# Patient Record
Sex: Male | Born: 1980 | Race: Black or African American | Hispanic: No | Marital: Single | Smoking: Former smoker
Health system: Southern US, Community
[De-identification: ages and names within clinical notes are randomized; demographics above are authoritative.]

## PROBLEM LIST (undated history)

## (undated) HISTORY — PX: SKIN GRAFT: SHX250

---
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
## 2004-10-13 ENCOUNTER — Emergency Department (HOSPITAL_COMMUNITY): Admission: EM | Admit: 2004-10-13 | Discharge: 2004-10-13 | Payer: Self-pay | Admitting: Family Medicine

## 2010-11-28 ENCOUNTER — Emergency Department (HOSPITAL_COMMUNITY)
Admission: EM | Admit: 2010-11-28 | Discharge: 2010-11-28 | Disposition: A | Discharge status: Home / Self Care | Payer: No Typology Code available for payment source | Attending: Emergency Medicine | Admitting: Emergency Medicine

## 2010-11-28 DIAGNOSIS — M545 Low back pain, unspecified: Secondary | ICD-10-CM | POA: Insufficient documentation

## 2010-11-28 DIAGNOSIS — S139XXA Sprain of joints and ligaments of unspecified parts of neck, initial encounter: Secondary | ICD-10-CM | POA: Insufficient documentation

## 2010-11-28 DIAGNOSIS — M542 Cervicalgia: Secondary | ICD-10-CM | POA: Insufficient documentation

## 2010-11-28 DIAGNOSIS — S335XXA Sprain of ligaments of lumbar spine, initial encounter: Secondary | ICD-10-CM | POA: Insufficient documentation

## 2011-07-26 ENCOUNTER — Emergency Department (HOSPITAL_COMMUNITY)
Admission: EM | Admit: 2011-07-26 | Discharge: 2011-07-26 | Disposition: A | Discharge status: Home / Self Care | Payer: Self-pay | Attending: Emergency Medicine | Admitting: Emergency Medicine

## 2011-07-26 ENCOUNTER — Encounter (HOSPITAL_COMMUNITY): Payer: Self-pay | Admitting: Emergency Medicine

## 2011-07-26 DIAGNOSIS — R070 Pain in throat: Secondary | ICD-10-CM | POA: Insufficient documentation

## 2011-07-26 DIAGNOSIS — F17200 Nicotine dependence, unspecified, uncomplicated: Secondary | ICD-10-CM | POA: Insufficient documentation

## 2011-07-26 DIAGNOSIS — R109 Unspecified abdominal pain: Secondary | ICD-10-CM | POA: Insufficient documentation

## 2011-07-26 DIAGNOSIS — IMO0001 Reserved for inherently not codable concepts without codable children: Secondary | ICD-10-CM | POA: Insufficient documentation

## 2011-07-26 DIAGNOSIS — R197 Diarrhea, unspecified: Secondary | ICD-10-CM | POA: Insufficient documentation

## 2011-07-26 DIAGNOSIS — R51 Headache: Secondary | ICD-10-CM | POA: Insufficient documentation

## 2011-07-26 DIAGNOSIS — R6889 Other general symptoms and signs: Secondary | ICD-10-CM | POA: Insufficient documentation

## 2011-07-26 DIAGNOSIS — R1110 Vomiting, unspecified: Secondary | ICD-10-CM | POA: Insufficient documentation

## 2011-07-26 DIAGNOSIS — R509 Fever, unspecified: Secondary | ICD-10-CM | POA: Insufficient documentation

## 2011-07-26 DIAGNOSIS — Z79899 Other long term (current) drug therapy: Secondary | ICD-10-CM | POA: Insufficient documentation

## 2011-07-26 DIAGNOSIS — J3489 Other specified disorders of nose and nasal sinuses: Secondary | ICD-10-CM | POA: Insufficient documentation

## 2011-07-26 DIAGNOSIS — H9209 Otalgia, unspecified ear: Secondary | ICD-10-CM | POA: Insufficient documentation

## 2011-07-26 DIAGNOSIS — R05 Cough: Secondary | ICD-10-CM | POA: Insufficient documentation

## 2011-07-26 DIAGNOSIS — R059 Cough, unspecified: Secondary | ICD-10-CM | POA: Insufficient documentation

## 2011-07-26 MED ORDER — GUAIFENESIN-CODEINE 100-6.3 MG/5ML PO SOLN: 5.0000 mL | Freq: Three times a day (TID) | ORAL | Status: DC

## 2011-07-26 NOTE — ED Notes (Signed)
Per Pt: cough for one week,  general malaise, some dizziness, poor appetite, and sore throat that started 2 nights ago. Hard time sleeping with cough. Headache also. Pt denies OTC medications for symptoms.

## 2011-07-26 NOTE — ED Provider Notes (Signed)
History     CSN: [id number]  Arrival date & time 07/26/11  1725   First MD Initiated Contact with Patient 07/26/11 2001      Chief Complaint  Patient presents with  . URI    (Consider location/radiation/quality/duration/timing/severity/associated sxs/prior treatment) HPI  Pt presents to the ED with complaints of flu-like symptoms of cough, congestion, sore throat, muscle aches, chills, fevers, ear pain, headaches, abdominal pain, vomiting, diarrhea. The patient states that the symptoms started 1 week ago.  Pt has been around other sick contacts and did not get the flu shot this year. The patient denies headaches, neck pain, weakness, vision changes, severe abdominal pain, inability to eat or drink, difficulty breathing, SOB, wheezing, chest pain. The patient has tried cough medicine, NSAIDS, and rest but has only felt mild relief.    History reviewed. No pertinent past medical history.  History reviewed. No pertinent past surgical history.  No family history on file.  History  Substance Use Topics  . Smoking status: Current Some Day Smoker  . Smokeless tobacco: Not on file  . Alcohol Use: No      Review of Systems  All other systems reviewed and are negative.    Allergies  Review of patient's allergies indicates no known allergies.  Home Medications   Current Outpatient Rx  Name Route Sig Dispense Refill  . GUAIFENESIN-CODEINE 100-6.3 MG/5ML PO SOLN Oral Take 5 mLs by mouth 3 (three) times daily. 150 mL 0    BP 112/63  Pulse 73  Temp(Src) 98.6 F (37 C) (Oral)  Resp 18  Ht 6\' 2"  (1.88 m)  Wt 185 lb (83.915 kg)  BMI 23.75 kg/m2  SpO2 99%  Physical Exam  Nursing note and vitals reviewed. Constitutional: He is oriented to person, place, and time. He appears well-developed and well-nourished.  HENT:  Head: Normocephalic and atraumatic.  Eyes: EOM are normal. Pupils are equal, round, and reactive to light.  Neck: Normal range of motion.  Cardiovascular:  Normal rate and regular rhythm.   Pulmonary/Chest: Effort normal and breath sounds normal.  Abdominal: Soft.  Musculoskeletal: Normal range of motion.  Neurological: He is alert and oriented to person, place, and time.  Skin: Skin is warm and dry.    ED Course  Procedures (including critical care time)  Labs Reviewed - No data to display No results found.   1. Flu-like symptoms       MDM  Pts flu-like symptoms. Treating symptomatically with Cough and pain medication.        Dorthula Matas, PA 07/26/11 2201

## 2011-07-27 NOTE — ED Provider Notes (Signed)
Medical screening examination/treatment/procedure(s) were performed by non-physician practitioner and as supervising physician I was immediately available for consultation/collaboration. Devoria Albe, MD, Armando Gang   Ward Givens, MD 07/27/11 1130

## 2012-06-08 ENCOUNTER — Emergency Department (HOSPITAL_COMMUNITY)
Admission: EM | Admit: 2012-06-08 | Discharge: 2012-06-08 | Disposition: A | Discharge status: Home / Self Care | Payer: Self-pay | Attending: Emergency Medicine | Admitting: Emergency Medicine

## 2012-06-08 ENCOUNTER — Encounter (HOSPITAL_COMMUNITY): Payer: Self-pay | Admitting: Emergency Medicine

## 2012-06-08 DIAGNOSIS — K921 Melena: Secondary | ICD-10-CM | POA: Insufficient documentation

## 2012-06-08 DIAGNOSIS — K6289 Other specified diseases of anus and rectum: Secondary | ICD-10-CM | POA: Insufficient documentation

## 2012-06-08 DIAGNOSIS — F17200 Nicotine dependence, unspecified, uncomplicated: Secondary | ICD-10-CM | POA: Insufficient documentation

## 2012-06-08 DIAGNOSIS — B379 Candidiasis, unspecified: Secondary | ICD-10-CM | POA: Insufficient documentation

## 2012-06-08 LAB — WET PREP, GENITAL
Clue Cells Wet Prep HPF POC: NONE SEEN
Trich, Wet Prep: NONE SEEN
WBC, Wet Prep HPF POC: NONE SEEN
Yeast Wet Prep HPF POC: NONE SEEN

## 2012-06-08 LAB — CBC
HCT: 42.7 % (ref 39.0–52.0)
Hemoglobin: 14.6 g/dL (ref 13.0–17.0)
MCH: 31.8 pg (ref 26.0–34.0)
MCHC: 34.2 g/dL (ref 30.0–36.0)
MCV: 93 fL (ref 78.0–100.0)
Platelets: 233 10*3/uL (ref 150–400)
RBC: 4.59 MIL/uL (ref 4.22–5.81)
RDW: 11.7 % (ref 11.5–15.5)
WBC: 7.3 10*3/uL (ref 4.0–10.5)

## 2012-06-08 LAB — BASIC METABOLIC PANEL
BUN: 11 mg/dL (ref 6–23)
CO2: 28 mEq/L (ref 19–32)
Calcium: 9.3 mg/dL (ref 8.4–10.5)
Chloride: 102 mEq/L (ref 96–112)
Creatinine, Ser: 1.1 mg/dL (ref 0.50–1.35)
GFR calc Af Amer: >90 mL/min (ref >90)
GFR calc non Af Amer: 89 mL/min — ABNORMAL LOW (ref >90)
Glucose, Bld: 96 mg/dL (ref 70–99)
Potassium: 3.5 mEq/L (ref 3.5–5.1)
Sodium: 141 mEq/L (ref 135–145)

## 2012-06-08 LAB — GLUCOSE, CAPILLARY: Glucose-Capillary: 89 mg/dL (ref 70–99)

## 2012-06-08 LAB — GRAM STAIN

## 2012-06-08 MED ORDER — MUPIROCIN CALCIUM 2 % EX CREA: TOPICAL_CREAM | Freq: Three times a day (TID) | CUTANEOUS | Status: DC

## 2012-06-08 MED ORDER — TRIAMCINOLONE ACETONIDE 0.1 % EX CREA: TOPICAL_CREAM | Freq: Two times a day (BID) | CUTANEOUS | Status: DC

## 2012-06-08 NOTE — ED Notes (Signed)
Patient reports that he has bleeding to his rectal area x 3 weeks. Reports that is it coming from his "dry skin"

## 2012-06-08 NOTE — ED Notes (Addendum)
NP at bedside.

## 2012-06-08 NOTE — ED Provider Notes (Signed)
History     CSN: [id number]  Arrival date & time 06/08/12  4098   First MD Initiated Contact with Patient 06/08/12 1034      Chief Complaint  Patient presents with  . Rectal Bleeding    (Consider location/radiation/quality/duration/timing/severity/associated sxs/prior treatment) Patient is a 31 y.o. male presenting with hematochezia. The history is provided by the patient and a friend. No language interpreter was used.  Rectal Bleeding  The current episode started more than 2 weeks ago. The onset was gradual. The problem occurs continuously. The problem has been gradually worsening. The pain is moderate. Associated symptoms include rectal pain. Pertinent negatives include no fever, no nausea and no vomiting. He has been behaving normally. He has been eating and drinking normally. His past medical history does not include recent change in diet or a recent illness.   31 year old male coming in with a rectal bleeding and itching and burning x1 month. Thick milky discharge from rectum. Patient has been using Benadryl cream with no improvement. States when he wipes his bottom there is blood streaks on the tissue. Patient denies any STD exposure. Patient has one male partner and uses condoms for protection. No dizziness. No past medical history of diabetes but there is diabetes in family.  History reviewed. No pertinent past medical history.  History reviewed. No pertinent past surgical history.  History reviewed. No pertinent family history.  History  Substance Use Topics  . Smoking status: Current Some Day Smoker  . Smokeless tobacco: Not on file  . Alcohol Use: No      Review of Systems  Constitutional: Negative.  Negative for fever.  HENT: Negative.  Negative for sore throat.   Eyes: Negative.   Respiratory: Negative.  Negative for shortness of breath.   Cardiovascular: Negative.   Gastrointestinal: Positive for hematochezia and rectal pain. Negative for nausea and vomiting.   Genitourinary: Negative for urgency, decreased urine volume, discharge, scrotal swelling, difficulty urinating and testicular pain.  Skin:       Rectum itching and irritated  Neurological: Negative.  Negative for dizziness and light-headedness.  Psychiatric/Behavioral: Negative.   All other systems reviewed and are negative.    Allergies  Review of patient's allergies indicates no known allergies.  Home Medications   Current Outpatient Rx  Name  Route  Sig  Dispense  Refill  . DIPHENHYDRAMINE-ZINC ACETATE 2-0.1 % EX CREA   Topical   Apply 1 application topically daily as needed.           BP 124/71  Pulse 66  Temp 98.4 F (36.9 C) (Oral)  Resp 18  SpO2 99%  Physical Exam  Nursing note and vitals reviewed. Constitutional: He is oriented to person, place, and time. He appears well-developed and well-nourished.  HENT:  Head: Normocephalic.  Eyes: Conjunctivae normal and EOM are normal. Pupils are equal, round, and reactive to light.  Neck: Normal range of motion. Neck supple.  Cardiovascular: Normal rate.   Pulmonary/Chest: Effort normal.  Abdominal: Soft.  Genitourinary:       Rectum with whitish discharge.  Skin around rectum irritated and peeling.  Musculoskeletal: Normal range of motion.  Neurological: He is alert and oriented to person, place, and time.  Skin: Skin is warm and dry.  Psychiatric: He has a normal mood and affect.    ED Course  Procedures (including critical care time)   Labs Reviewed  CBC  WET PREP, GENITAL  GLUCOSE, CAPILLARY  BASIC METABOLIC PANEL  OCCULT BLOOD X 1 CARD  TO LAB, STOOL  GRAM STAIN   No results found.   1. Yeast infection       MDM  Rectal irritation with itching and bleeding x 3 weeks.  Gram stain shows moderate positive cocci.  Wet prep shows no yeast infection.  - guiac.  Hgb 14.6.  Glucose 96. R for bactriban and kenalog.  He will follow up with pcp of choice.  Working on Museum/gallery curator.   Labs Reviewed   BASIC METABOLIC PANEL - Abnormal; Notable for the following:    GFR calc non Af Amer 89 (*)     All other components within normal limits  CBC  WET PREP, GENITAL  GLUCOSE, CAPILLARY  GRAM STAIN  LAB REPORT - SCANNED  OCCULT BLOOD, POC DEVICE  OCCULT BLOOD, POC DEVICE  OCCULT BLOOD, POC DEVICE  OCCULT BLOOD X 1 CARD TO LAB, STOOL          Remi Haggard, NP 06/09/12 1309

## 2012-06-09 LAB — OCCULT BLOOD, POC DEVICE: Fecal Occult Bld: NEGATIVE

## 2012-06-09 NOTE — ED Provider Notes (Signed)
Medical screening examination/treatment/procedure(s) were performed by non-physician practitioner and as supervising physician I was immediately available for consultation/collaboration.    Mills Mitton L Romaldo Saville, MD 06/09/12 2246 

## 2012-08-02 ENCOUNTER — Emergency Department (HOSPITAL_BASED_OUTPATIENT_CLINIC_OR_DEPARTMENT_OTHER)
Admission: EM | Admit: 2012-08-02 | Discharge: 2012-08-03 | Disposition: A | Discharge status: Home / Self Care | Payer: No Typology Code available for payment source | Attending: Emergency Medicine | Admitting: Emergency Medicine

## 2012-08-02 ENCOUNTER — Encounter (HOSPITAL_BASED_OUTPATIENT_CLINIC_OR_DEPARTMENT_OTHER): Payer: Self-pay | Admitting: *Deleted

## 2012-08-02 DIAGNOSIS — IMO0002 Reserved for concepts with insufficient information to code with codable children: Secondary | ICD-10-CM | POA: Insufficient documentation

## 2012-08-02 DIAGNOSIS — Y9389 Activity, other specified: Secondary | ICD-10-CM | POA: Insufficient documentation

## 2012-08-02 DIAGNOSIS — S139XXA Sprain of joints and ligaments of unspecified parts of neck, initial encounter: Secondary | ICD-10-CM | POA: Insufficient documentation

## 2012-08-02 DIAGNOSIS — Z87891 Personal history of nicotine dependence: Secondary | ICD-10-CM | POA: Insufficient documentation

## 2012-08-02 DIAGNOSIS — Y92410 Unspecified street and highway as the place of occurrence of the external cause: Secondary | ICD-10-CM | POA: Insufficient documentation

## 2012-08-02 DIAGNOSIS — T148XXA Other injury of unspecified body region, initial encounter: Secondary | ICD-10-CM
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
## 2012-08-03 MED ORDER — IBUPROFEN 800 MG PO TABS
800.0000 mg | ORAL_TABLET | Freq: Once | ORAL | Status: AC
  Administered 2012-08-03: 800 mg via ORAL
  Filled 2012-08-03: qty 1

## 2012-08-03 MED ORDER — IBUPROFEN 800 MG PO TABS: 800.0000 mg | ORAL_TABLET | Freq: Three times a day (TID) | ORAL | Status: DC | PRN

## 2012-08-03 MED ORDER — CYCLOBENZAPRINE HCL 10 MG PO TABS: 10.0000 mg | ORAL_TABLET | Freq: Three times a day (TID) | ORAL | Status: DC | PRN

## 2012-08-03 MED ORDER — CYCLOBENZAPRINE HCL 10 MG PO TABS
10.0000 mg | ORAL_TABLET | Freq: Once | ORAL | Status: AC
  Administered 2012-08-03: 10 mg via ORAL
  Filled 2012-08-03: qty 1
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
## 2012-08-03 NOTE — ED Notes (Signed)
D/c home with rx x 2 for ibuprofen and flexeril- ice pack given for home use- pt has a ride

## 2012-09-10 ENCOUNTER — Encounter (HOSPITAL_BASED_OUTPATIENT_CLINIC_OR_DEPARTMENT_OTHER): Payer: Self-pay | Admitting: Emergency Medicine

## 2012-09-10 ENCOUNTER — Emergency Department (HOSPITAL_BASED_OUTPATIENT_CLINIC_OR_DEPARTMENT_OTHER)
Admission: EM | Admit: 2012-09-10 | Discharge: 2012-09-10 | Disposition: A | Discharge status: Home / Self Care | Payer: No Typology Code available for payment source | Attending: Emergency Medicine | Admitting: Emergency Medicine

## 2012-09-10 DIAGNOSIS — Z87891 Personal history of nicotine dependence: Secondary | ICD-10-CM | POA: Insufficient documentation

## 2012-09-10 DIAGNOSIS — L299 Pruritus, unspecified: Secondary | ICD-10-CM | POA: Insufficient documentation

## 2012-09-10 NOTE — ED Notes (Signed)
MD at bedside. 

## 2012-09-10 NOTE — ED Notes (Signed)
Pt c/o itching all over x 1 week

## 2012-09-10 NOTE — ED Provider Notes (Addendum)
History     CSN: [id number]  Arrival date & time 09/10/12  0145   None     Chief Complaint  Patient presents with  . Pruritis    (Consider location/radiation/quality/duration/timing/severity/associated sxs/prior treatment) The history is provided by the patient.  Patient has been itching all over x 1 week.  Thinks he may have gotten new detergent but no rash.  No swelling of the lips tongue or uvula.  No hives.  States he has red bumps in his groin.  No additional symptoms.    History reviewed. No pertinent past medical history.  History reviewed. No pertinent past surgical history.  No family history on file.  History  Substance Use Topics  . Smoking status: Former Games developer  . Smokeless tobacco: Not on file  . Alcohol Use: No      Review of Systems  HENT: Negative for facial swelling.   Respiratory: Negative for cough.   Skin: Negative for color change and wound.  All other systems reviewed and are negative.    Allergies  Penicillins  Home Medications   Current Outpatient Rx  Name  Route  Sig  Dispense  Refill  . cyclobenzaprine (FLEXERIL) 10 MG tablet   Oral   Take 1 tablet (10 mg total) by mouth 3 (three) times daily as needed for muscle spasms.   30 tablet   0   . diphenhydrAMINE-zinc acetate (BENADRYL) cream   Topical   Apply 1 application topically daily as needed.         Marland Kitchen ibuprofen (ADVIL,MOTRIN) 800 MG tablet   Oral   Take 1 tablet (800 mg total) by mouth every 8 (eight) hours as needed for pain.   30 tablet   0   . mupirocin cream (BACTROBAN) 2 %   Topical   Apply topically 3 (three) times daily.   15 g   0   . triamcinolone cream (KENALOG) 0.1 %   Topical   Apply topically 2 (two) times daily.   30 g   0     BP 121/73  Pulse 68  Temp(Src) 97.8 F (36.6 C) (Oral)  Resp 16  Ht 6\' 1"  (1.854 m)  Wt 185 lb (83.915 kg)  BMI 24.41 kg/m2  SpO2 98%  Physical Exam  Constitutional: He is oriented to person, place, and time. He  appears well-developed and well-nourished. No distress.  HENT:  Head: Normocephalic and atraumatic.  Mouth/Throat: Oropharynx is clear and moist.  Eyes: Conjunctivae are normal. Pupils are equal, round, and reactive to light.  Neck: Normal range of motion. Neck supple.  Cardiovascular: Normal rate and regular rhythm.   Pulmonary/Chest: Effort normal and breath sounds normal. No stridor. He has no wheezes. He has no rales.  Abdominal: Soft. Bowel sounds are normal.  Musculoskeletal: Normal range of motion.  Neurological: He is alert and oriented to person, place, and time.  Skin: Skin is warm and dry. No rash noted. No erythema.  Examined for rash in groin.  None seen  Psychiatric: He has a normal mood and affect.    ED Course  Procedures (including critical care time)  Labs Reviewed - No data to display No results found.   1. Itching       MDM  Pruritis without rash.  No insect bites no hives.  Suspect environmental etiology.  Recommend benadryl 1 tab every six hours as needed for itching and close examination of soaps and detergents to see if there is anything new that the patient  may be reacting to.  Return for hives or concerning symptoms        April K Palumbo-Rasch, MD 09/10/12 0206  April K Palumbo-Rasch, MD 09/10/12 [phone]

## 2012-09-12 ENCOUNTER — Ambulatory Visit: Payer: No Typology Code available for payment source | Admitting: Internal Medicine

## 2012-10-03 ENCOUNTER — Emergency Department (HOSPITAL_COMMUNITY)
Admission: EM | Admit: 2012-10-03 | Discharge: 2012-10-04 | Disposition: A | Discharge status: Home / Self Care | Payer: No Typology Code available for payment source | Attending: Emergency Medicine | Admitting: Emergency Medicine

## 2012-10-03 DIAGNOSIS — R21 Rash and other nonspecific skin eruption: Secondary | ICD-10-CM | POA: Insufficient documentation

## 2012-10-03 DIAGNOSIS — Z87891 Personal history of nicotine dependence: Secondary | ICD-10-CM | POA: Insufficient documentation

## 2012-10-03 DIAGNOSIS — B86 Scabies: Secondary | ICD-10-CM | POA: Insufficient documentation

## 2012-10-03 NOTE — ED Provider Notes (Signed)
History    This chart was scribed for non-physician practitioner working with Sunnie Nielsen, MD by Leone Payor, ED Scribe. This patient was seen in room WTR7/WTR7 and the patient's care was started at 2314.   CSN: [id number]  Arrival date & time 10/03/12  2314   First MD Initiated Contact with Patient 10/03/12 2351      Chief Complaint  Patient presents with  . Scabies     The history is provided by the patient. No language interpreter was used.    Daniel Hamilton is a 32 y.o. male who presents to the Emergency Department complaining of scabies starting a couple of weeks ago. Pt states he isn't living any where new and the people around him do not show similar symptoms. Pt states he has itchy spots in between his fingers. Pt states he is using "medicated" lotion that stops the itching but has not cleared up the spots. Pt has also tried benadryl but believes he is allergic to it. Onset of symptoms gradual. Course is constant. Nothing makes symptoms worse.  Pt is a former smoker but denies alcohol use.   No significant past medical history.  No significant past surgical history.  No family history on file.  History  Substance Use Topics  . Smoking status: Former Games developer  . Smokeless tobacco: Not on file  . Alcohol Use: No      Review of Systems  Constitutional: Negative for fever.  HENT: Negative for sore throat and rhinorrhea.   Eyes: Negative for redness.  Respiratory: Negative for cough.   Cardiovascular: Negative for chest pain.  Gastrointestinal: Negative for nausea, vomiting, abdominal pain and diarrhea.  Genitourinary: Negative for dysuria.  Musculoskeletal: Negative for myalgias.  Skin: Positive for rash.  Neurological: Negative for headaches.    Allergies  Penicillins  Home Medications   Current Outpatient Rx  Name  Route  Sig  Dispense  Refill  . cyclobenzaprine (FLEXERIL) 10 MG tablet   Oral   Take 1 tablet (10 mg total) by mouth 3 (three) times  daily as needed for muscle spasms.   30 tablet   0   . diphenhydrAMINE-zinc acetate (BENADRYL) cream   Topical   Apply 1 application topically daily as needed.         Marland Kitchen ibuprofen (ADVIL,MOTRIN) 800 MG tablet   Oral   Take 1 tablet (800 mg total) by mouth every 8 (eight) hours as needed for pain.   30 tablet   0   . mupirocin cream (BACTROBAN) 2 %   Topical   Apply topically 3 (three) times daily.   15 g   0   . triamcinolone cream (KENALOG) 0.1 %   Topical   Apply topically 2 (two) times daily.   30 g   0     BP 109/64  Pulse 86  Temp(Src) 99.2 F (37.3 C) (Oral)  Resp 16  SpO2 98%  Physical Exam  Nursing note and vitals reviewed. Constitutional: He appears well-developed and well-nourished. No distress.  HENT:  Head: Normocephalic and atraumatic.  Eyes: Conjunctivae and EOM are normal.  Neck: Normal range of motion. Neck supple. No tracheal deviation present.  Cardiovascular: Normal rate.   Pulmonary/Chest: Effort normal. No respiratory distress.  Musculoskeletal: Normal range of motion.  Neurological: He is alert.  Skin: Skin is warm and dry.  Punctate papules scattered over body sparing palms and face. Mild excoriation. Consistent with scabies infestation.  Psychiatric: He has a normal mood and affect. His  behavior is normal.    ED Course  Procedures (including critical care time)  DIAGNOSTIC STUDIES: Oxygen Saturation is 98% on room air, normal by my interpretation.    COORDINATION OF CARE: 11:55 PM Discussed treatment plan with pt at bedside and pt agreed to plan.    Labs Reviewed - No data to display No results found.   1. Scabies     Patient seen and examined.   Vital signs reviewed and are as follows: Filed Vitals:   10/04/12 0019  BP: 109/64  Pulse: 86  Temp: 99.2 F (37.3 C)  Resp: 16   Patient counseled on need to wash bedding, clothes, and vacuum floors.     MDM  Patient with itchy rash consistent with scabies  infestation. Treatment given. No anaphylaxis or allergic reaction suspected. No infectious etiology suspected.   I personally performed the services described in this documentation, which was scribed in my presence. The recorded information has been reviewed and is accurate.      Renne Crigler, PA-C 10/04/12 [phone]

## 2012-10-04 MED ORDER — PERMETHRIN 5 % EX CREA
TOPICAL_CREAM | CUTANEOUS | Status: DC
Start: 2012-10-04 — End: 2013-01-02

## 2012-10-04 MED ORDER — HYDROXYZINE HCL 25 MG PO TABS: 25.0000 mg | ORAL_TABLET | Freq: Four times a day (QID) | ORAL | Status: DC

## 2012-10-04 NOTE — ED Provider Notes (Signed)
Medical screening examination/treatment/procedure(s) were performed by non-physician practitioner and as supervising physician I was immediately available for consultation/collaboration.  Aslynn Brunetti, MD 10/04/12 0604 

## 2012-10-04 NOTE — ED Notes (Signed)
Pt has itching all over body. 

## 2013-01-02 ENCOUNTER — Emergency Department (HOSPITAL_COMMUNITY)
Admission: EM | Admit: 2013-01-02 | Discharge: 2013-01-02 | Disposition: A | Discharge status: Home / Self Care | Payer: No Typology Code available for payment source | Attending: Emergency Medicine | Admitting: Emergency Medicine

## 2013-01-02 ENCOUNTER — Encounter (HOSPITAL_COMMUNITY): Payer: Self-pay | Admitting: Emergency Medicine

## 2013-01-02 ENCOUNTER — Emergency Department (HOSPITAL_COMMUNITY): Payer: No Typology Code available for payment source

## 2013-01-02 DIAGNOSIS — R42 Dizziness and giddiness: Secondary | ICD-10-CM | POA: Insufficient documentation

## 2013-01-02 DIAGNOSIS — R0609 Other forms of dyspnea: Secondary | ICD-10-CM | POA: Insufficient documentation

## 2013-01-02 DIAGNOSIS — Z880 Allergy status to penicillin: Secondary | ICD-10-CM | POA: Insufficient documentation

## 2013-01-02 DIAGNOSIS — R0989 Other specified symptoms and signs involving the circulatory and respiratory systems: Secondary | ICD-10-CM | POA: Insufficient documentation

## 2013-01-02 DIAGNOSIS — J690 Pneumonitis due to inhalation of food and vomit: Secondary | ICD-10-CM | POA: Insufficient documentation

## 2013-01-02 DIAGNOSIS — R05 Cough: Secondary | ICD-10-CM | POA: Insufficient documentation

## 2013-01-02 DIAGNOSIS — R000 Tachycardia, unspecified: Secondary | ICD-10-CM | POA: Insufficient documentation

## 2013-01-02 DIAGNOSIS — R51 Headache: Secondary | ICD-10-CM | POA: Insufficient documentation

## 2013-01-02 DIAGNOSIS — R509 Fever, unspecified: Secondary | ICD-10-CM | POA: Insufficient documentation

## 2013-01-02 DIAGNOSIS — F17200 Nicotine dependence, unspecified, uncomplicated: Secondary | ICD-10-CM | POA: Insufficient documentation

## 2013-01-02 DIAGNOSIS — R059 Cough, unspecified: Secondary | ICD-10-CM | POA: Insufficient documentation

## 2013-01-02 LAB — POCT I-STAT, CHEM 8
BUN: 8 mg/dL (ref 6–23)
Calcium, Ion: 1.11 mmol/L — ABNORMAL LOW (ref 1.12–1.23)
Chloride: 98 mEq/L (ref 96–112)
HCT: 44 % (ref 39.0–52.0)
Potassium: 3.2 mEq/L — ABNORMAL LOW (ref 3.5–5.1)
Sodium: 139 mEq/L (ref 135–145)

## 2013-01-02 LAB — CG4 I-STAT (LACTIC ACID): Lactic Acid, Venous: 0.84 mmol/L (ref 0.5–2.2)

## 2013-01-02 MED ORDER — AZITHROMYCIN 250 MG PO TABS: 250.0000 mg | ORAL_TABLET | Freq: Every day | ORAL | Status: DC

## 2013-01-02 MED ORDER — AZITHROMYCIN 250 MG PO TABS
500.0000 mg | ORAL_TABLET | Freq: Once | ORAL | Status: AC
  Administered 2013-01-02: 500 mg via ORAL
  Filled 2013-01-02: qty 1

## 2013-01-02 MED ORDER — ACETAMINOPHEN 325 MG PO TABS
650.0000 mg | ORAL_TABLET | Freq: Once | ORAL | Status: AC
Start: 2013-01-02 — End: 2013-01-02
  Administered 2013-01-02: 650 mg via ORAL
  Filled 2013-01-02: qty 2

## 2013-01-02 MED ORDER — SODIUM CHLORIDE 0.9 % IV BOLUS (SEPSIS)
1000.0000 mL | Freq: Once | INTRAVENOUS | Status: AC
  Administered 2013-01-02: 1000 mL via INTRAVENOUS

## 2013-01-02 NOTE — ED Notes (Signed)
Pt reports having chills, fever, and dizziness for two days now. Pt denies N/V/D. Pt reports shortness of breath upon ambulation, lung sounds are CTA. Pt reports feeling dehydrated. NAD.

## 2013-01-02 NOTE — ED Provider Notes (Signed)
History    CSN: [id number] Arrival date & time 01/02/13  2000  First MD Initiated Contact with Patient 01/02/13 2013     Chief Complaint  Patient presents with  . Shortness of Breath  . Fever  . Dizziness   (Consider location/radiation/quality/duration/timing/severity/associated sxs/prior Treatment) HPI  32 year old male presents today complaining of fever chills, headache, and cough. Cough is nonproductive. He has had some mild dyspnea. He has not have an appetite but has not had any vomiting. He is taking in fluids without difficulty. He took Alka-Seltzer but has not taken anything today. Symptoms have been present for 2 days. They are worsening in nature. He does have sick contacts. He has no significant past medical history and hospitalizations in the past. History reviewed. No pertinent past medical history. History reviewed. No pertinent past surgical history. No family history on file. History  Substance Use Topics  . Smoking status: Current Some Day Smoker    Types: Cigarettes  . Smokeless tobacco: Never Used  . Alcohol Use: No    Review of Systems  All other systems reviewed and are negative.    Allergies  Penicillins  Home Medications   Current Outpatient Rx  Name  Route  Sig  Dispense  Refill  . Pseudoeph-Doxylamine-DM-APAP (NYQUIL PO)   Oral   Take 30 mLs by mouth every 8 (eight) hours as needed (for cold symptoms).          BP 117/71  Pulse 94  Temp(Src) 100.2 F (37.9 C) (Oral)  Resp 20  SpO2 97% Physical Exam  Nursing note and vitals reviewed. Constitutional: He is oriented to person, place, and time. He appears well-developed and well-nourished.  HENT:  Head: Normocephalic and atraumatic.  Right Ear: External ear normal.  Left Ear: External ear normal.  Nose: Nose normal.  Mouth/Throat: Oropharynx is clear and moist.  Eyes: Conjunctivae and EOM are normal. Pupils are equal, round, and reactive to light.  Neck: Normal range of motion.  Neck supple.  Cardiovascular: Regular rhythm, normal heart sounds and intact distal pulses.  Tachycardia present.   Pulmonary/Chest: Effort normal and breath sounds normal. No respiratory distress. He has no wheezes. He exhibits no tenderness.  Abdominal: Soft. Bowel sounds are normal. He exhibits no distension and no mass. There is no tenderness. There is no guarding.  Musculoskeletal: Normal range of motion.  Neurological: He is alert and oriented to person, place, and time. He has normal reflexes. He exhibits normal muscle tone. Coordination normal.  Skin: Skin is warm and dry.  Psychiatric: He has a normal mood and affect. His behavior is normal. Judgment and thought content normal.    ED Course  Procedures (including critical care time) Labs Reviewed  POCT I-STAT, CHEM 8 - Abnormal; Notable for the following:    Potassium 3.2 (*)    Glucose, Bld 105 (*)    Calcium, Ion 1.11 (*)    All other components within normal limits  CG4 I-STAT (LACTIC ACID)   Dg Chest 2 View  01/02/2013   *RADIOLOGY REPORT*  Clinical Data: Fever and cough.  CHEST - 2 VIEW  Comparison:  None.  Findings:  The heart size and mediastinal contours are within normal limits. There are increased densities in the posterior lung base on the lateral view.  These densities may be within the medial right lower lobe based on the frontal view.  Trachea is midline. No definite pleural effusions.  IMPRESSION: Streaky densities that are probably within the posterior right lower lobe.  Findings are concerning for a subtle area of infection.   Original Report Authenticated By: Richarda Overlie, M.D.   No diagnosis found.  MDM  Patient with normal vital signs.  Temperature decreased with Tylenol and ibuprofen. He has not vomited here. Lactic acid is normal. Question of some density in the posterior right lower lobe. He is given Zithromax. He is advised regarding return precautions and treatment.  Hilario Quarry, MD 01/02/13 2209

## 2013-01-02 NOTE — ED Notes (Signed)
MD Ray at bedside. 

## 2013-05-02 ENCOUNTER — Emergency Department (HOSPITAL_BASED_OUTPATIENT_CLINIC_OR_DEPARTMENT_OTHER): Payer: No Typology Code available for payment source

## 2013-05-02 ENCOUNTER — Emergency Department (HOSPITAL_BASED_OUTPATIENT_CLINIC_OR_DEPARTMENT_OTHER)
Admission: EM | Admit: 2013-05-02 | Discharge: 2013-05-02 | Disposition: A | Discharge status: Home / Self Care | Payer: No Typology Code available for payment source | Attending: Emergency Medicine | Admitting: Emergency Medicine

## 2013-05-02 ENCOUNTER — Encounter (HOSPITAL_BASED_OUTPATIENT_CLINIC_OR_DEPARTMENT_OTHER): Payer: Self-pay | Admitting: Emergency Medicine

## 2013-05-02 DIAGNOSIS — S139XXA Sprain of joints and ligaments of unspecified parts of neck, initial encounter: Secondary | ICD-10-CM | POA: Insufficient documentation

## 2013-05-02 DIAGNOSIS — Y92410 Unspecified street and highway as the place of occurrence of the external cause: Secondary | ICD-10-CM | POA: Insufficient documentation

## 2013-05-02 DIAGNOSIS — Z880 Allergy status to penicillin: Secondary | ICD-10-CM | POA: Insufficient documentation

## 2013-05-02 DIAGNOSIS — F17200 Nicotine dependence, unspecified, uncomplicated: Secondary | ICD-10-CM | POA: Insufficient documentation

## 2013-05-02 DIAGNOSIS — S161XXA Strain of muscle, fascia and tendon at neck level, initial encounter: Secondary | ICD-10-CM

## 2013-05-02 DIAGNOSIS — Z792 Long term (current) use of antibiotics: Secondary | ICD-10-CM | POA: Insufficient documentation

## 2013-05-02 DIAGNOSIS — Y9389 Activity, other specified: Secondary | ICD-10-CM | POA: Insufficient documentation

## 2013-05-02 MED ORDER — IBUPROFEN 800 MG PO TABS: 800.0000 mg | ORAL_TABLET | Freq: Three times a day (TID) | ORAL | Status: DC
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
## 2013-05-02 NOTE — ED Provider Notes (Signed)
Medical screening examination/treatment/procedure(s) were performed by non-physician practitioner and as supervising physician I was immediately available for consultation/collaboration.  EKG Interpretation   None        Doug Sou, MD 05/02/13 1533

## 2013-11-13 ENCOUNTER — Emergency Department (HOSPITAL_BASED_OUTPATIENT_CLINIC_OR_DEPARTMENT_OTHER)
Admission: EM | Admit: 2013-11-13 | Discharge: 2013-11-13 | Disposition: A | Discharge status: Home / Self Care | Payer: No Typology Code available for payment source | Attending: Emergency Medicine | Admitting: Emergency Medicine

## 2013-11-13 ENCOUNTER — Encounter (HOSPITAL_BASED_OUTPATIENT_CLINIC_OR_DEPARTMENT_OTHER): Payer: Self-pay | Admitting: Emergency Medicine

## 2013-11-13 DIAGNOSIS — Z880 Allergy status to penicillin: Secondary | ICD-10-CM | POA: Insufficient documentation

## 2013-11-13 DIAGNOSIS — L739 Follicular disorder, unspecified: Secondary | ICD-10-CM

## 2013-11-13 DIAGNOSIS — Z791 Long term (current) use of non-steroidal anti-inflammatories (NSAID): Secondary | ICD-10-CM | POA: Insufficient documentation

## 2013-11-13 DIAGNOSIS — L738 Other specified follicular disorders: Secondary | ICD-10-CM | POA: Insufficient documentation

## 2013-11-13 DIAGNOSIS — F17200 Nicotine dependence, unspecified, uncomplicated: Secondary | ICD-10-CM | POA: Insufficient documentation

## 2013-11-13 DIAGNOSIS — L678 Other hair color and hair shaft abnormalities: Secondary | ICD-10-CM | POA: Insufficient documentation

## 2013-11-13 DIAGNOSIS — Z792 Long term (current) use of antibiotics: Secondary | ICD-10-CM | POA: Insufficient documentation

## 2013-11-13 NOTE — ED Notes (Signed)
Pt amb to room 11 with quick steady gait in nad. Pt reports having a "big pimple bump" on his right buttock a few weeks ago, pt states "my girlfriend popped it and it went away". Pt reports a new "pimple bump" on his left buttock x last night. Denies any fevers, states "It feels sore".

## 2013-11-13 NOTE — Discharge Instructions (Signed)
Folliculitis  Folliculitis is redness, soreness, and swelling (inflammation) of the hair follicles. This condition can occur anywhere on the body. People with weakened immune systems, diabetes, or obesity have a greater risk of getting folliculitis. CAUSES  Bacterial infection. This is the most common cause.  Fungal infection.  Viral infection.  Contact with certain chemicals, especially oils and tars. Long-term folliculitis can result from bacteria that live in the nostrils. The bacteria may trigger multiple outbreaks of folliculitis over time. SYMPTOMS Folliculitis most commonly occurs on the scalp, thighs, legs, back, buttocks, and areas where hair is shaved frequently. An early sign of folliculitis is a small, white or yellow, pus-filled, itchy lesion (pustule). These lesions appear on a red, inflamed follicle. They are usually less than 0.2 inches (5 mm) wide. When there is an infection of the follicle that goes deeper, it becomes a boil or furuncle. A group of closely packed boils creates a larger lesion (carbuncle). Carbuncles tend to occur in hairy, sweaty areas of the body. DIAGNOSIS  Your caregiver can usually tell what is wrong by doing a physical exam. A sample may be taken from one of the lesions and tested in a lab. This can help determine what is causing your folliculitis. TREATMENT  Treatment may include:  Applying warm compresses to the affected areas.  Taking antibiotic medicines orally or applying them to the skin.  Draining the lesions if they contain a large amount of pus or fluid.  Laser hair removal for cases of long-lasting folliculitis. This helps to prevent regrowth of the hair. HOME CARE INSTRUCTIONS  Apply warm compresses to the affected areas as directed by your caregiver.  If antibiotics are prescribed, take them as directed. Finish them even if you start to feel better.  You may take over-the-counter medicines to relieve itching.  Do not shave  irritated skin.  Follow up with your caregiver as directed. SEEK IMMEDIATE MEDICAL CARE IF:   You have increasing redness, swelling, or pain in the affected area.  You have a fever. MAKE SURE YOU:  Understand these instructions.  Will watch your condition.  Will get help right away if you are not doing well or get worse. Document Released: 08/27/2001 Document Revised: 12/18/2011 Document Reviewed: 09/18/2011 [employer] Patient Information 2014 [employer]  

## 2013-11-13 NOTE — ED Provider Notes (Signed)
CSN: [id number]     Arrival date & time 11/13/13  0815 History   First MD Initiated Contact with Patient 11/13/13 0825     Chief Complaint  Patient presents with  . abcess      (Consider location/radiation/quality/duration/timing/severity/associated sxs/prior Treatment) HPI Comments: Patient presents with a sore to his buttocks. He states he had a small pimple on his right buttocks that his girlfriend talked about a week ago and it went away. He now states she's had a one-day history of a small bump on his left buttocks. It's a little bit sore. He denies any other past history of skin infections. He denies any fevers or vomiting.   History reviewed. No pertinent past medical history. History reviewed. No pertinent past surgical history. History reviewed. No pertinent family history. History  Substance Use Topics  . Smoking status: Current Every Day Smoker    Types: Cigarettes  . Smokeless tobacco: Never Used  . Alcohol Use: No    Review of Systems  Constitutional: Negative for fever.  Gastrointestinal: Negative for nausea and vomiting.  Musculoskeletal: Negative for arthralgias, back pain, joint swelling and neck pain.  Skin: Positive for wound.  Neurological: Negative for weakness, numbness and headaches.      Allergies  Penicillins  Home Medications   Prior to Admission medications   Medication Sig Start Date End Date Taking? Authorizing Provider  diphenhydrAMINE (BENADRYL) 25 mg capsule Take 25 mg by mouth every 6 (six) hours as needed for allergies.   Yes Historical Provider, MD  azithromycin (ZITHROMAX Z-PAK) 250 MG tablet Take 1 tablet (250 mg total) by mouth daily. 01/02/13   Hilario Quarryanielle S Ray, MD  ibuprofen (ADVIL,MOTRIN) 800 MG tablet Take 1 tablet (800 mg total) by mouth 3 (three) times daily. 05/02/13   Elson AreasLeslie K Sofia, PA-C  Pseudoeph-Doxylamine-DM-APAP (NYQUIL PO) Take 30 mLs by mouth every 8 (eight) hours as needed (for cold symptoms).    Historical Provider, MD    BP 116/78  Pulse 73  Temp(Src) 97.9 F (36.6 C) (Oral)  Resp 18  Ht 6\' 1"  (1.854 m)  Wt 180 lb (81.647 kg)  BMI 23.75 kg/m2  SpO2 100% Physical Exam  Constitutional: He is oriented to person, place, and time. He appears well-developed and well-nourished.  HENT:  Head: Normocephalic and atraumatic.  Neck: Normal range of motion. Neck supple.  Cardiovascular: Normal rate.   Pulmonary/Chest: Effort normal.  Musculoskeletal: He exhibits no edema and no tenderness.  Patient has a small pimple on his left buttocks with a very mild amount of surrounding erythema. There is no induration or fluctuance.  Neurological: He is alert and oriented to person, place, and time.  Skin: Skin is warm and dry.  Psychiatric: He has a normal mood and affect.    ED Course  Procedures (including critical care time) Labs Review Labs Reviewed - No data to display  Imaging Review No results found.   EKG Interpretation None      MDM   Final diagnoses:  Folliculitis    The pimple had a small head on it. I removed the head with a needle.  I gave him betadine scrubs to use for the next few days.  Does not appear to need I&D or systemic abx.  Advised to return if symptoms worsen.    Rolan BuccoMelanie Billy Turvey, MD 11/13/13 [phone]

## 2015-08-15 ENCOUNTER — Emergency Department (HOSPITAL_BASED_OUTPATIENT_CLINIC_OR_DEPARTMENT_OTHER): Payer: No Typology Code available for payment source

## 2015-08-15 ENCOUNTER — Emergency Department (HOSPITAL_BASED_OUTPATIENT_CLINIC_OR_DEPARTMENT_OTHER)
Admission: EM | Admit: 2015-08-15 | Discharge: 2015-08-15 | Disposition: A | Discharge status: Home / Self Care | Payer: No Typology Code available for payment source | Attending: Emergency Medicine | Admitting: Emergency Medicine

## 2015-08-15 ENCOUNTER — Encounter (HOSPITAL_BASED_OUTPATIENT_CLINIC_OR_DEPARTMENT_OTHER): Payer: Self-pay | Admitting: Emergency Medicine

## 2015-08-15 DIAGNOSIS — Y9389 Activity, other specified: Secondary | ICD-10-CM | POA: Insufficient documentation

## 2015-08-15 DIAGNOSIS — F17210 Nicotine dependence, cigarettes, uncomplicated: Secondary | ICD-10-CM | POA: Insufficient documentation

## 2015-08-15 DIAGNOSIS — Y92410 Unspecified street and highway as the place of occurrence of the external cause: Secondary | ICD-10-CM | POA: Diagnosis not present

## 2015-08-15 DIAGNOSIS — Z880 Allergy status to penicillin: Secondary | ICD-10-CM | POA: Diagnosis not present

## 2015-08-15 DIAGNOSIS — S199XXA Unspecified injury of neck, initial encounter: Secondary | ICD-10-CM | POA: Insufficient documentation

## 2015-08-15 DIAGNOSIS — Y998 Other external cause status: Secondary | ICD-10-CM | POA: Diagnosis not present

## 2015-08-15 DIAGNOSIS — R202 Paresthesia of skin: Secondary | ICD-10-CM | POA: Insufficient documentation

## 2015-08-15 DIAGNOSIS — S3992XA Unspecified injury of lower back, initial encounter: Secondary | ICD-10-CM | POA: Diagnosis not present

## 2015-08-15 MED ORDER — ACETAMINOPHEN 500 MG PO TABS
1000.0000 mg | ORAL_TABLET | Freq: Once | ORAL | Status: AC
Start: 2015-08-15 — End: 2015-08-15
  Administered 2015-08-15: 1000 mg via ORAL
  Filled 2015-08-15: qty 2

## 2015-08-15 MED ORDER — IBUPROFEN 800 MG PO TABS
800.0000 mg | ORAL_TABLET | Freq: Once | ORAL | Status: AC
  Administered 2015-08-15: 800 mg via ORAL
  Filled 2015-08-15: qty 1
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
## 2015-12-28 ENCOUNTER — Emergency Department (HOSPITAL_BASED_OUTPATIENT_CLINIC_OR_DEPARTMENT_OTHER)
Admission: EM | Admit: 2015-12-28 | Discharge: 2015-12-28 | Disposition: A | Discharge status: Home / Self Care | Payer: No Typology Code available for payment source | Attending: Emergency Medicine | Admitting: Emergency Medicine

## 2015-12-28 ENCOUNTER — Encounter (HOSPITAL_BASED_OUTPATIENT_CLINIC_OR_DEPARTMENT_OTHER): Payer: Self-pay

## 2015-12-28 DIAGNOSIS — Y999 Unspecified external cause status: Secondary | ICD-10-CM | POA: Diagnosis not present

## 2015-12-28 DIAGNOSIS — Z87891 Personal history of nicotine dependence: Secondary | ICD-10-CM | POA: Diagnosis not present

## 2015-12-28 DIAGNOSIS — M545 Low back pain, unspecified: Secondary | ICD-10-CM

## 2015-12-28 DIAGNOSIS — R51 Headache: Secondary | ICD-10-CM

## 2015-12-28 DIAGNOSIS — Y9389 Activity, other specified: Secondary | ICD-10-CM | POA: Diagnosis not present

## 2015-12-28 DIAGNOSIS — Y92410 Unspecified street and highway as the place of occurrence of the external cause: Secondary | ICD-10-CM | POA: Diagnosis not present

## 2015-12-28 DIAGNOSIS — R519 Headache, unspecified: Secondary | ICD-10-CM

## 2015-12-28 MED ORDER — METHOCARBAMOL 500 MG PO TABS: 500.0000 mg | ORAL_TABLET | Freq: Two times a day (BID) | ORAL | Status: DC | PRN

## 2015-12-28 MED ORDER — NAPROXEN 250 MG PO TABS: 250.0000 mg | ORAL_TABLET | Freq: Two times a day (BID) | ORAL | Status: DC

## 2015-12-28 NOTE — Discharge Instructions (Signed)
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
## 2016-02-26 ENCOUNTER — Emergency Department (HOSPITAL_BASED_OUTPATIENT_CLINIC_OR_DEPARTMENT_OTHER): Payer: Self-pay

## 2016-02-26 ENCOUNTER — Encounter (HOSPITAL_BASED_OUTPATIENT_CLINIC_OR_DEPARTMENT_OTHER): Payer: Self-pay | Admitting: Emergency Medicine

## 2016-02-26 ENCOUNTER — Emergency Department (HOSPITAL_BASED_OUTPATIENT_CLINIC_OR_DEPARTMENT_OTHER)
Admission: EM | Admit: 2016-02-26 | Discharge: 2016-02-26 | Disposition: A | Discharge status: Home / Self Care | Payer: Self-pay | Attending: Emergency Medicine | Admitting: Emergency Medicine

## 2016-02-26 DIAGNOSIS — Z87891 Personal history of nicotine dependence: Secondary | ICD-10-CM | POA: Insufficient documentation

## 2016-02-26 DIAGNOSIS — S62309A Unspecified fracture of unspecified metacarpal bone, initial encounter for closed fracture: Secondary | ICD-10-CM

## 2016-02-26 DIAGNOSIS — S62357A Nondisplaced fracture of shaft of fifth metacarpal bone, left hand, initial encounter for closed fracture: Secondary | ICD-10-CM | POA: Insufficient documentation

## 2016-02-26 DIAGNOSIS — S62316A Displaced fracture of base of fifth metacarpal bone, right hand, initial encounter for closed fracture: Secondary | ICD-10-CM | POA: Insufficient documentation

## 2016-02-26 DIAGNOSIS — Y999 Unspecified external cause status: Secondary | ICD-10-CM | POA: Insufficient documentation

## 2016-02-26 DIAGNOSIS — Y9389 Activity, other specified: Secondary | ICD-10-CM | POA: Insufficient documentation

## 2016-02-26 DIAGNOSIS — Y929 Unspecified place or not applicable: Secondary | ICD-10-CM | POA: Insufficient documentation

## 2016-02-26 MED ORDER — IBUPROFEN 800 MG PO TABS: 800.0000 mg | ORAL_TABLET | Freq: Three times a day (TID) | ORAL | 0 refills | Status: DC | PRN

## 2016-02-26 MED ORDER — OXYCODONE-ACETAMINOPHEN 5-325 MG PO TABS
1.0000 | ORAL_TABLET | Freq: Once | ORAL | Status: AC
  Administered 2016-02-26: 1 via ORAL
  Filled 2016-02-26: qty 1

## 2016-02-26 MED ORDER — HYDROCODONE-ACETAMINOPHEN 5-325 MG PO TABS: 1.0000 | ORAL_TABLET | Freq: Four times a day (QID) | ORAL | 0 refills | Status: DC | PRN

## 2016-02-26 MED ORDER — IBUPROFEN 800 MG PO TABS
800.0000 mg | ORAL_TABLET | Freq: Once | ORAL | Status: AC
  Administered 2016-02-26: 800 mg via ORAL
  Filled 2016-02-26: qty 1
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
## 2016-02-26 NOTE — Discharge Instructions (Signed)
Return here as needed. Follow up with the orthopedist provided.  °
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
## 2017-07-02 HISTORY — PX: SKIN GRAFT: SHX250

## 2018-12-10 ENCOUNTER — Other Ambulatory Visit: Payer: Self-pay

## 2018-12-10 ENCOUNTER — Emergency Department (HOSPITAL_BASED_OUTPATIENT_CLINIC_OR_DEPARTMENT_OTHER)
Admission: EM | Admit: 2018-12-10 | Discharge: 2018-12-10 | Disposition: A | Discharge status: Home / Self Care | Payer: No Typology Code available for payment source | Attending: Emergency Medicine | Admitting: Emergency Medicine

## 2018-12-10 ENCOUNTER — Encounter (HOSPITAL_BASED_OUTPATIENT_CLINIC_OR_DEPARTMENT_OTHER): Payer: Self-pay

## 2018-12-10 DIAGNOSIS — Z79899 Other long term (current) drug therapy: Secondary | ICD-10-CM | POA: Diagnosis not present

## 2018-12-10 DIAGNOSIS — F17210 Nicotine dependence, cigarettes, uncomplicated: Secondary | ICD-10-CM | POA: Insufficient documentation

## 2018-12-10 DIAGNOSIS — R51 Headache: Secondary | ICD-10-CM | POA: Insufficient documentation

## 2018-12-10 MED ORDER — ACETAMINOPHEN 325 MG PO TABS
650.0000 mg | ORAL_TABLET | Freq: Once | ORAL | Status: AC
Start: 2018-12-10 — End: 2018-12-10
  Administered 2018-12-10: 650 mg via ORAL
  Filled 2018-12-10: qty 2

## 2018-12-10 MED ORDER — ONDANSETRON 4 MG PO TBDP
4.0000 mg | ORAL_TABLET | Freq: Once | ORAL | Status: AC
  Administered 2018-12-10: 4 mg via ORAL
  Filled 2018-12-10: qty 1

## 2018-12-10 NOTE — Discharge Instructions (Addendum)
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
## 2018-12-10 NOTE — ED Notes (Signed)
ED Provider at bedside. 
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
## 2018-12-14 ENCOUNTER — Other Ambulatory Visit: Payer: Self-pay

## 2018-12-14 ENCOUNTER — Emergency Department (HOSPITAL_BASED_OUTPATIENT_CLINIC_OR_DEPARTMENT_OTHER)
Admission: EM | Admit: 2018-12-14 | Discharge: 2018-12-14 | Disposition: A | Discharge status: Home / Self Care | Payer: No Typology Code available for payment source | Attending: Emergency Medicine | Admitting: Emergency Medicine

## 2018-12-14 ENCOUNTER — Encounter (HOSPITAL_BASED_OUTPATIENT_CLINIC_OR_DEPARTMENT_OTHER): Payer: Self-pay | Admitting: Emergency Medicine

## 2018-12-14 ENCOUNTER — Emergency Department (HOSPITAL_BASED_OUTPATIENT_CLINIC_OR_DEPARTMENT_OTHER): Payer: No Typology Code available for payment source

## 2018-12-14 DIAGNOSIS — Y92410 Unspecified street and highway as the place of occurrence of the external cause: Secondary | ICD-10-CM | POA: Diagnosis not present

## 2018-12-14 DIAGNOSIS — S060X0A Concussion without loss of consciousness, initial encounter: Secondary | ICD-10-CM | POA: Diagnosis not present

## 2018-12-14 DIAGNOSIS — Y939 Activity, unspecified: Secondary | ICD-10-CM | POA: Insufficient documentation

## 2018-12-14 DIAGNOSIS — Y999 Unspecified external cause status: Secondary | ICD-10-CM | POA: Insufficient documentation

## 2018-12-14 DIAGNOSIS — R51 Headache: Secondary | ICD-10-CM | POA: Diagnosis present

## 2018-12-14 DIAGNOSIS — F17210 Nicotine dependence, cigarettes, uncomplicated: Secondary | ICD-10-CM | POA: Insufficient documentation

## 2018-12-14 DIAGNOSIS — S46911A Strain of unspecified muscle, fascia and tendon at shoulder and upper arm level, right arm, initial encounter: Secondary | ICD-10-CM | POA: Diagnosis not present

## 2018-12-14 MED ORDER — ONDANSETRON 4 MG PO TBDP: 4.0000 mg | ORAL_TABLET | Freq: Three times a day (TID) | ORAL | 0 refills | Status: DC | PRN

## 2018-12-14 MED ORDER — NAPROXEN 375 MG PO TABS: 375.0000 mg | ORAL_TABLET | Freq: Two times a day (BID) | ORAL | 0 refills | Status: DC

## 2018-12-14 MED ORDER — KETOROLAC TROMETHAMINE 60 MG/2ML IM SOLN
30.0000 mg | Freq: Once | INTRAMUSCULAR | Status: AC
  Administered 2018-12-14: 30 mg via INTRAMUSCULAR
  Filled 2018-12-14: qty 2

## 2018-12-14 NOTE — ED Notes (Signed)
Patient transported to X-ray 
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
## 2018-12-14 NOTE — Discharge Instructions (Signed)
If you develop continued, recurrent, or worsening headache, fever, neck stiffness, vomiting, blurry or double vision, weakness or numbness in your arms or legs, trouble speaking, or any other new/concerning symptoms then return to the ER for evaluation.   You are being given naproxen today.  Do not take any other NSAIDs such as ibuprofen, Advil, Aleve, Motrin, Naprosyn, etc.
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
## 2018-12-30 ENCOUNTER — Other Ambulatory Visit: Payer: Self-pay

## 2018-12-30 ENCOUNTER — Encounter: Payer: Self-pay | Admitting: Critical Care Medicine

## 2018-12-30 ENCOUNTER — Ambulatory Visit: Payer: Self-pay | Attending: Critical Care Medicine | Admitting: Critical Care Medicine

## 2018-12-30 DIAGNOSIS — S0990XA Unspecified injury of head, initial encounter: Secondary | ICD-10-CM | POA: Insufficient documentation

## 2018-12-30 DIAGNOSIS — F419 Anxiety disorder, unspecified: Secondary | ICD-10-CM | POA: Insufficient documentation

## 2018-12-30 DIAGNOSIS — S060X9A Concussion with loss of consciousness of unspecified duration, initial encounter: Secondary | ICD-10-CM | POA: Insufficient documentation

## 2018-12-30 DIAGNOSIS — S060XAA Concussion with loss of consciousness status unknown, initial encounter: Secondary | ICD-10-CM | POA: Insufficient documentation

## 2018-12-30 DIAGNOSIS — F17200 Nicotine dependence, unspecified, uncomplicated: Secondary | ICD-10-CM | POA: Insufficient documentation

## 2018-12-30 DIAGNOSIS — S060X0D Concussion without loss of consciousness, subsequent encounter: Secondary | ICD-10-CM

## 2018-12-30 DIAGNOSIS — G44319 Acute post-traumatic headache, not intractable: Secondary | ICD-10-CM

## 2018-12-30 DIAGNOSIS — F329 Major depressive disorder, single episode, unspecified: Secondary | ICD-10-CM

## 2018-12-30 DIAGNOSIS — S0990XD Unspecified injury of head, subsequent encounter: Secondary | ICD-10-CM

## 2018-12-30 MED ORDER — IBUPROFEN 800 MG PO TABS: 800.0000 mg | ORAL_TABLET | Freq: Three times a day (TID) | ORAL | 0 refills | Status: DC | PRN

## 2018-12-30 MED ORDER — ONDANSETRON 4 MG PO TBDP: 4.0000 mg | ORAL_TABLET | Freq: Three times a day (TID) | ORAL | 0 refills | Status: DC | PRN

## 2018-12-30 MED ORDER — CYCLOBENZAPRINE HCL 10 MG PO TABS: 10.0000 mg | ORAL_TABLET | Freq: Three times a day (TID) | ORAL | 0 refills | Status: DC | PRN

## 2018-12-30 MED ORDER — AMITRIPTYLINE HCL 50 MG PO TABS: 50.0000 mg | ORAL_TABLET | Freq: Every day | ORAL | 1 refills | Status: DC
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
## 2019-01-01 ENCOUNTER — Other Ambulatory Visit: Payer: Self-pay

## 2019-01-01 ENCOUNTER — Ambulatory Visit (HOSPITAL_COMMUNITY)
Admission: RE | Admit: 2019-01-01 | Discharge: 2019-01-01 | Disposition: A | Discharge status: Home / Self Care | Payer: Self-pay | Source: Ambulatory Visit | Attending: Critical Care Medicine | Admitting: Critical Care Medicine

## 2019-01-01 DIAGNOSIS — G44319 Acute post-traumatic headache, not intractable: Secondary | ICD-10-CM | POA: Insufficient documentation

## 2019-01-05 ENCOUNTER — Telehealth: Payer: Self-pay | Admitting: General Practice

## 2019-01-05 NOTE — Telephone Encounter (Signed)
Patient states he has already spoke to Dr. Joya Gaskins. Reminded of appointment scheduled at 3:30 on 01/06/2019

## 2019-01-05 NOTE — Telephone Encounter (Signed)
Patient called called in regards to a missed call from [place] please follow up.

## 2019-01-06 ENCOUNTER — Encounter: Payer: Self-pay | Admitting: Critical Care Medicine

## 2019-01-06 ENCOUNTER — Ambulatory Visit: Payer: Self-pay | Attending: Critical Care Medicine | Admitting: Critical Care Medicine

## 2019-01-06 ENCOUNTER — Other Ambulatory Visit: Payer: Self-pay

## 2019-01-06 VITALS — BP 109/72 | HR 96 | Temp 99.1°F | Ht 74.0 in | Wt 188.6 lb

## 2019-01-06 DIAGNOSIS — F329 Major depressive disorder, single episode, unspecified: Secondary | ICD-10-CM

## 2019-01-06 DIAGNOSIS — M25511 Pain in right shoulder: Secondary | ICD-10-CM

## 2019-01-06 DIAGNOSIS — S060X0D Concussion without loss of consciousness, subsequent encounter: Secondary | ICD-10-CM

## 2019-01-06 DIAGNOSIS — S0990XD Unspecified injury of head, subsequent encounter: Secondary | ICD-10-CM

## 2019-01-06 DIAGNOSIS — F419 Anxiety disorder, unspecified: Secondary | ICD-10-CM

## 2019-01-06 DIAGNOSIS — G44319 Acute post-traumatic headache, not intractable: Secondary | ICD-10-CM

## 2019-01-06 DIAGNOSIS — M25519 Pain in unspecified shoulder: Secondary | ICD-10-CM | POA: Insufficient documentation

## 2019-01-06 MED ORDER — INDOMETHACIN 50 MG PO CAPS: 50.0000 mg | ORAL_CAPSULE | Freq: Two times a day (BID) | ORAL | 0 refills | Status: DC

## 2019-01-06 MED ORDER — AMITRIPTYLINE HCL 50 MG PO TABS: 100.0000 mg | ORAL_TABLET | Freq: Every day | ORAL | 1 refills | Status: DC

## 2019-01-06 MED ORDER — PROPRANOLOL HCL 10 MG PO TABS
10.0000 mg | ORAL_TABLET | Freq: Three times a day (TID) | ORAL | 0 refills | Status: DC
Start: 2019-01-06 — End: 2019-02-03

## 2019-01-06 NOTE — Assessment & Plan Note (Signed)
As per intractable headache assessment

## 2019-01-06 NOTE — Patient Instructions (Addendum)
In increase amitriptyline to 100 mg at bedtime, you can take 2 of the 50 mg tablets daily for this and take this at bedtime  Discontinue ibuprofen  Begin indomethacin 50 mg twice daily  Begin propranolol 10 mg 3 times daily  Continue to use cyclobenzaprine as needed for muscle spasm  Continue to use Zofran as needed for nausea  Referrals have been made to orthopedic surgery and neurology  See handout on postconcussion syndrome and also reviewed instructions below  A referral to our clinical social work will be made to discuss your anxiety and depression  Concussion, Adult  A concussion is a brain injury from a hard, direct hit (trauma) to your head or body. This direct hit causes the brain to quickly shake back and forth inside the skull. A concussion may also be called a mild traumatic brain injury (TBI). Healing from this injury can take time. What are the causes? This condition is caused by:  A direct hit to your head, such as: ? Running into a player during a game. ? Being hit in a fight. ? Hitting your head on a hard surface.  A quick and sudden movement (jolt) of the head or neck, such as in a car crash. What are the signs or symptoms? The signs of a concussion can be hard to notice. They may be missed by you, family members, and doctors. You may look fine on the outside but may not act or feel normal. Physical symptoms  Headaches.  Being tired (fatigued).  Being dizzy.  Problems with body balance.  Problems seeing or hearing.  Being sensitive to light or noise.  Feeling sick to your stomach (nausea) or throwing up (vomiting).  Not sleeping or eating as you used to.  Loss of feeling (numbness) or tingling in the body.  Seizure. Mental and emotional symptoms  Problems remembering things.  Trouble focusing your mind (concentrating), organizing, or making decisions.  Being slow to think, act, react, speak, or read.  Feeling grouchy  (irritable).  Having mood changes.  Feeling worried or nervous (anxious).  Feeling sad (depressed). How is this treated? This condition may be treated by:  Stopping sports or activity if you are injured. If you hit your head or have signs of concussion: ? Do not return to sports or activities the same day. ? Get checked by a doctor before you return to your activities.  Resting your body and your mind.  Being watched carefully, often at home.  Medicines to help with symptoms such as: ? Feeling sick to your stomach. ? Headaches. ? Problems with sleep.  Avoid taking strong pain medicines (opioids) for a concussion.  Avoiding alcohol and drugs.  Being asked to go to a concussion clinic or a place to help you recover (rehabilitation center). Recovery from a concussion can take time. Return to activities only:  When you are fully healed.  When your doctor says it is safe. Follow these instructions at home: Activity  Limit activities that need a lot of thought or focus, such as: ? Homework or work for your job. ? Watching TV. ? Using the computer or phone. ? Playing memory games and puzzles.  Rest. Rest helps your brain heal. Make sure you: ? Get plenty of sleep. Most adults should get 7-9 hours of sleep each night. ? Rest during the day. Take naps or breaks when you feel tired.  Avoid activity like exercise until your doctor says its safe. Stop any activity that makes symptoms  worse.  Do not do activities that could cause a second concussion, such as riding a bike or playing sports.  Ask your doctor when you can return to your normal activities, such as school, work, sports, and driving. Your ability to react may be slower. Do not do these activities if you are dizzy. General instructions   Take over-the-counter and prescription medicines only as told by your doctor.  Do not drink alcohol until your doctor says you can.  Watch your symptoms and tell other people  to do the same. Other problems can occur after a concussion. Older adults have a higher risk of serious problems.  Tell your work Freight forwarder, teachers, Government social research officer, school counselor, coach, or Product/process development scientist about your injury and symptoms. Tell them about what you can or cannot do.  Keep all follow-up visits as told by your doctor. This is important. How is this prevented?  It is very important that you do not get another brain injury. In rare cases, another injury can cause brain damage that will not go away, brain swelling, or death. The risk of this is greatest in the first 7-10 days after a head injury. To avoid injuries: ? Stop activities that could lead to a second concussion, such as contact sports, until your doctor says it is okay. ? When you return to sports or activities:  Do not crash into other players. This is how most concussions happen.  Follow the rules.  Respect other players. ? Get regular exercise. Do strength and balance training. ? Wear a helmet that fits you well during sports, biking, or other activities.  Helmets can help protect you from serious skull and brain injuries, but they do not protect you from a concussion. Even when wearing a helmet, you should avoid being hit in the head. Contact a doctor if:  Your symptoms get worse or they do not get better.  You have new symptoms.  You have another injury. Get help right away if:  You have bad headaches or your headaches get worse.  You feel weak or numb in any part of your body.  You are mixed up (confused).  Your balance gets worse.  You keep throwing up.  You feel more sleepy than normal.  Your speech is not clear (is slurred).  You cannot recognize people or places.  You have a seizure.  Others have trouble waking you up.  You have behavior changes.  You have changes in how you see (vision).  You pass out (lose consciousness). Summary  A concussion is a brain injury from a hard,  direct hit (trauma) to your head or body.  This condition is treated with rest and careful watching of symptoms.  If you keep having symptoms, call your doctor. This information is not intended to replace advice given to you by your health care provider. Make sure you discuss any questions you have with your health care provider. Document Released: 06/06/2009 Document Revised: 02/06/2018 Document Reviewed: 02/06/2018 Elsevier Patient Education  2020 [employer].

## 2019-01-06 NOTE — Assessment & Plan Note (Signed)
Significant anxiety and depression syndrome  Plan will be for the patient to receive counseling from our licensed clinical social worker
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
## 2019-01-06 NOTE — Assessment & Plan Note (Signed)
Postconcussion syndrome with ongoing headaches and significant anxiety  CT of the head was negative and I have yet to write for an MRI at this time instead would prefer a neurology consultation first  We will prescribe propranolol 10 mg 3 times daily and increase Elavil to 100 mg at bedtime and discontinue ibuprofen in favor of indomethacin 50 mg twice daily at meals  Post concussion education was given to the patient

## 2019-01-08 ENCOUNTER — Ambulatory Visit: Payer: Self-pay | Attending: Family Medicine | Admitting: Licensed Clinical Social Worker

## 2019-01-08 ENCOUNTER — Other Ambulatory Visit: Payer: Self-pay

## 2019-01-08 DIAGNOSIS — F4323 Adjustment disorder with mixed anxiety and depressed mood: Secondary | ICD-10-CM
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
## 2019-01-12 ENCOUNTER — Ambulatory Visit (INDEPENDENT_AMBULATORY_CARE_PROVIDER_SITE_OTHER): Payer: Self-pay | Admitting: Orthopedic Surgery

## 2019-01-12 ENCOUNTER — Encounter: Payer: Self-pay | Admitting: Orthopedic Surgery

## 2019-01-12 ENCOUNTER — Other Ambulatory Visit: Payer: Self-pay

## 2019-01-12 DIAGNOSIS — S40011A Contusion of right shoulder, initial encounter: Secondary | ICD-10-CM

## 2019-01-12 MED ORDER — MELOXICAM 15 MG PO TABS: ORAL_TABLET | ORAL | 0 refills | Status: DC
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
## 2019-01-15 ENCOUNTER — Telehealth: Payer: Self-pay | Admitting: Critical Care Medicine

## 2019-01-15 MED ORDER — CITALOPRAM HYDROBROMIDE 20 MG PO TABS: 20.0000 mg | ORAL_TABLET | Freq: Every day | ORAL | 3 refills | Status: DC

## 2019-01-15 NOTE — Telephone Encounter (Signed)
Pt with ongoing anxiety.   Plan will be : rx Citalopram 20mg  daily  Pt to keep neurology appt.

## 2019-01-15 NOTE — Telephone Encounter (Signed)
-----  Message from Rebekah Chesterfield, LCSW sent at 01/09/2019  9:48 AM EDT ----- Regarding: Anxiety medication Dr. Joya Gaskins,   I met with this patient yesterday. He is interested in medication management for anxiety, states current regimen isn't providing any relief. We discussed coping skills, in addition, to initiating psychiatry/counseling; however, he does not think they are feasible at this time due to barriers (transportation and financial strain)
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
## 2019-02-03 ENCOUNTER — Other Ambulatory Visit: Payer: Self-pay

## 2019-02-03 ENCOUNTER — Ambulatory Visit (INDEPENDENT_AMBULATORY_CARE_PROVIDER_SITE_OTHER): Payer: Self-pay | Admitting: Neurology

## 2019-02-03 ENCOUNTER — Encounter: Payer: Self-pay | Admitting: Neurology

## 2019-02-03 VITALS — BP 107/73 | HR 80 | Temp 98.6°F | Ht 74.0 in | Wt 188.0 lb

## 2019-02-03 DIAGNOSIS — F4322 Adjustment disorder with anxiety: Secondary | ICD-10-CM

## 2019-02-03 DIAGNOSIS — R430 Anosmia: Secondary | ICD-10-CM

## 2019-02-03 DIAGNOSIS — R432 Parageusia: Secondary | ICD-10-CM

## 2019-02-03 DIAGNOSIS — G44319 Acute post-traumatic headache, not intractable: Secondary | ICD-10-CM

## 2019-02-03 MED ORDER — SUMATRIPTAN SUCCINATE 100 MG PO TABS: ORAL_TABLET | ORAL | 3 refills | Status: DC

## 2019-02-03 MED ORDER — AMITRIPTYLINE HCL 50 MG PO TABS: 150.0000 mg | ORAL_TABLET | Freq: Every day | ORAL | 3 refills | Status: DC

## 2019-02-03 NOTE — Patient Instructions (Signed)
1.  Increase amitriptyline to 150mg  at bedtime.  Stop propranolol 2.  Stop indomethacin.  At earliest onset of headache, take sumatriptan 100mg .  May repeat dose once after 2 hours if needed.  May take with ondansetron for nausea 3.  [place] to let them know you are interested in counseling 4.  When you get coverage for the [facility], contact me and we can order an MRI 5.  Follow up in 4 months.

## 2019-02-04 ENCOUNTER — Telehealth: Payer: Self-pay | Admitting: General Practice

## 2019-02-04 NOTE — Telephone Encounter (Signed)
Pt would like a call back from the counselor he saw on 01/08/19 in regards to his anxiety, please follow up

## 2019-02-06 NOTE — Telephone Encounter (Signed)
Return call placed to patient. Patient reports increase in panic attacks. He is interested in establishing behavioral health services. LCSW provided pt contact information for [facility] of the [place] for psychotherapy and psychiatry. Pt was strongly encouraged to contact her with any additional questions or concerns. Pt verbalized understanding and was appreciative for the information.

## 2019-02-09 ENCOUNTER — Ambulatory Visit (INDEPENDENT_AMBULATORY_CARE_PROVIDER_SITE_OTHER): Payer: Self-pay | Admitting: Orthopedic Surgery

## 2019-02-09 ENCOUNTER — Encounter: Payer: Self-pay | Admitting: Orthopedic Surgery

## 2019-02-09 ENCOUNTER — Other Ambulatory Visit: Payer: Self-pay

## 2019-02-09 DIAGNOSIS — M25511 Pain in right shoulder: Secondary | ICD-10-CM

## 2019-02-13 ENCOUNTER — Encounter: Payer: Self-pay | Admitting: Orthopedic Surgery
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
## 2019-03-12 ENCOUNTER — Ambulatory Visit
Admission: RE | Admit: 2019-03-12 | Discharge: 2019-03-12 | Disposition: A | Discharge status: Home / Self Care | Payer: Self-pay | Source: Ambulatory Visit | Attending: Orthopedic Surgery | Admitting: Orthopedic Surgery

## 2019-03-12 ENCOUNTER — Other Ambulatory Visit: Payer: Self-pay

## 2019-03-12 DIAGNOSIS — M25511 Pain in right shoulder: Secondary | ICD-10-CM

## 2019-03-12 MED ORDER — IOPAMIDOL (ISOVUE-M 200) INJECTION 41%
13.0000 mL | Freq: Once | INTRAMUSCULAR | Status: AC
  Administered 2019-03-12: 16:00:00 13 mL via INTRA_ARTICULAR

## 2019-03-16 ENCOUNTER — Ambulatory Visit: Payer: Self-pay | Admitting: Orthopedic Surgery

## 2019-03-18 ENCOUNTER — Ambulatory Visit: Payer: Self-pay | Admitting: Orthopedic Surgery

## 2019-06-15 ENCOUNTER — Ambulatory Visit: Payer: Self-pay | Admitting: Neurology

## 2019-06-25 ENCOUNTER — Emergency Department (HOSPITAL_BASED_OUTPATIENT_CLINIC_OR_DEPARTMENT_OTHER): Payer: No Typology Code available for payment source

## 2019-06-25 ENCOUNTER — Emergency Department (HOSPITAL_BASED_OUTPATIENT_CLINIC_OR_DEPARTMENT_OTHER)
Admission: EM | Admit: 2019-06-25 | Discharge: 2019-06-25 | Disposition: A | Discharge status: Home / Self Care | Payer: No Typology Code available for payment source | Attending: Emergency Medicine | Admitting: Emergency Medicine

## 2019-06-25 ENCOUNTER — Other Ambulatory Visit: Payer: Self-pay

## 2019-06-25 ENCOUNTER — Encounter (HOSPITAL_BASED_OUTPATIENT_CLINIC_OR_DEPARTMENT_OTHER): Payer: Self-pay | Admitting: Emergency Medicine

## 2019-06-25 DIAGNOSIS — S99921A Unspecified injury of right foot, initial encounter: Secondary | ICD-10-CM | POA: Diagnosis present

## 2019-06-25 DIAGNOSIS — Y939 Activity, unspecified: Secondary | ICD-10-CM | POA: Diagnosis not present

## 2019-06-25 DIAGNOSIS — Z87891 Personal history of nicotine dependence: Secondary | ICD-10-CM | POA: Diagnosis not present

## 2019-06-25 DIAGNOSIS — Y999 Unspecified external cause status: Secondary | ICD-10-CM | POA: Diagnosis not present

## 2019-06-25 DIAGNOSIS — Z79899 Other long term (current) drug therapy: Secondary | ICD-10-CM | POA: Diagnosis not present

## 2019-06-25 DIAGNOSIS — S93601A Unspecified sprain of right foot, initial encounter: Secondary | ICD-10-CM

## 2019-06-25 DIAGNOSIS — Y92410 Unspecified street and highway as the place of occurrence of the external cause: Secondary | ICD-10-CM | POA: Diagnosis not present
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
## 2019-06-25 NOTE — ED Notes (Signed)
ED Provider at bedside. 

## 2019-06-25 NOTE — ED Notes (Signed)
Denies OTC meds for pain PTA.
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
## 2019-06-25 NOTE — ED Notes (Signed)
X RAY at bedside 

## 2019-06-25 NOTE — Discharge Instructions (Addendum)
Rest.  Elevate your foot is much as possible for the next 2 days.  Ice for 20 minutes every 2 hours while awake for the next 2 days.  Follow-up with your primary doctor if symptoms or not improving in the next 1 to 2 weeks.

## 2019-11-11 ENCOUNTER — Emergency Department (HOSPITAL_COMMUNITY)
Admission: EM | Admit: 2019-11-11 | Discharge: 2019-11-11 | Discharge status: Absent without leave | Payer: No Typology Code available for payment source

## 2019-11-12 ENCOUNTER — Emergency Department (HOSPITAL_BASED_OUTPATIENT_CLINIC_OR_DEPARTMENT_OTHER)
Admission: EM | Admit: 2019-11-12 | Discharge: 2019-11-12 | Disposition: A | Discharge status: Home / Self Care | Payer: 59 | Attending: Emergency Medicine | Admitting: Emergency Medicine

## 2019-11-12 ENCOUNTER — Other Ambulatory Visit: Payer: Self-pay

## 2019-11-12 ENCOUNTER — Encounter (HOSPITAL_BASED_OUTPATIENT_CLINIC_OR_DEPARTMENT_OTHER): Payer: Self-pay | Admitting: *Deleted

## 2019-11-12 DIAGNOSIS — Y92019 Unspecified place in single-family (private) house as the place of occurrence of the external cause: Secondary | ICD-10-CM | POA: Insufficient documentation

## 2019-11-12 DIAGNOSIS — T22012A Burn of unspecified degree of left forearm, initial encounter: Secondary | ICD-10-CM | POA: Diagnosis present

## 2019-11-12 DIAGNOSIS — Z5189 Encounter for other specified aftercare: Secondary | ICD-10-CM

## 2019-11-12 DIAGNOSIS — X000XXA Exposure to flames in uncontrolled fire in building or structure, initial encounter: Secondary | ICD-10-CM | POA: Diagnosis not present

## 2019-11-12 DIAGNOSIS — Y939 Activity, unspecified: Secondary | ICD-10-CM | POA: Insufficient documentation

## 2019-11-12 DIAGNOSIS — Y999 Unspecified external cause status: Secondary | ICD-10-CM | POA: Insufficient documentation

## 2019-11-12 DIAGNOSIS — T22212A Burn of second degree of left forearm, initial encounter: Secondary | ICD-10-CM | POA: Insufficient documentation

## 2019-11-12 MED ORDER — SILVER SULFADIAZINE 1 % EX CREA
TOPICAL_CREAM | CUTANEOUS | Status: AC
  Filled 2019-11-12: qty 85

## 2019-11-12 MED ORDER — MORPHINE SULFATE (PF) 4 MG/ML IV SOLN
4.0000 mg | Freq: Once | INTRAVENOUS | Status: AC
  Administered 2019-11-12: 4 mg via INTRAMUSCULAR
  Filled 2019-11-12: qty 1

## 2019-11-12 MED ORDER — KETOROLAC TROMETHAMINE 60 MG/2ML IM SOLN
60.0000 mg | Freq: Once | INTRAMUSCULAR | Status: AC
  Administered 2019-11-12: 60 mg via INTRAMUSCULAR
  Filled 2019-11-12: qty 2

## 2019-11-12 MED ORDER — OXYCODONE HCL 5 MG PO TABS: 5.0000 mg | ORAL_TABLET | ORAL | 0 refills | Status: DC | PRN

## 2019-11-12 NOTE — Discharge Instructions (Signed)
Take Tylenol 1000 mg 4 times a day for 1 week. This is the maximum dose of Tylenol (acetaminophen) you can take from all sources. Please check other over-the-counter medications and prescriptions to ensure you are not taking other medications that contain acetaminophen.  You may also take ibuprofen 400 mg 6 times a day alternating with or at the same time as tylenol.  Take oxycodone as needed for breakthrough pain.  This medication can be addicting, sedating and cause constipation.   °
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
## 2021-03-12 ENCOUNTER — Encounter (HOSPITAL_BASED_OUTPATIENT_CLINIC_OR_DEPARTMENT_OTHER): Payer: Self-pay | Admitting: *Deleted

## 2021-03-12 ENCOUNTER — Other Ambulatory Visit: Payer: Self-pay

## 2021-03-12 ENCOUNTER — Emergency Department (HOSPITAL_BASED_OUTPATIENT_CLINIC_OR_DEPARTMENT_OTHER)
Admission: EM | Admit: 2021-03-12 | Discharge: 2021-03-12 | Disposition: A | Discharge status: Home / Self Care | Payer: 59 | Attending: Emergency Medicine | Admitting: Emergency Medicine

## 2021-03-12 DIAGNOSIS — Z20822 Contact with and (suspected) exposure to covid-19: Secondary | ICD-10-CM | POA: Diagnosis not present

## 2021-03-12 DIAGNOSIS — R509 Fever, unspecified: Secondary | ICD-10-CM | POA: Diagnosis present

## 2021-03-12 DIAGNOSIS — B349 Viral infection, unspecified: Secondary | ICD-10-CM | POA: Insufficient documentation

## 2021-03-12 DIAGNOSIS — F17210 Nicotine dependence, cigarettes, uncomplicated: Secondary | ICD-10-CM | POA: Diagnosis not present

## 2021-03-12 MED ORDER — ACETAMINOPHEN 325 MG PO TABS: 650.0000 mg | ORAL_TABLET | Freq: Once | ORAL | Status: AC | PRN

## 2021-03-12 MED ORDER — ACETAMINOPHEN 325 MG PO TABS
ORAL_TABLET | ORAL | Status: AC
  Administered 2021-03-12: 650 mg via ORAL
  Filled 2021-03-12: qty 2

## 2021-03-12 NOTE — ED Notes (Signed)
ED Provider at bedside. 

## 2021-03-12 NOTE — Discharge Instructions (Signed)
Continue current medications.  Tylenol or ibuprofen for fever

## 2021-03-12 NOTE — ED Triage Notes (Signed)
Pt reports chills and bodyaches since working in the rain on Thursday. Temp 102.8 in triage

## 2021-03-13 LAB — SARS CORONAVIRUS 2 (TAT 6-24 HRS): SARS Coronavirus 2: NEGATIVE

## 2021-03-14 NOTE — ED Provider Notes (Signed)
[facility] EMERGENCY DEPARTMENT Provider Note   CSN: [id number] Arrival date & time: 03/12/21  1649     History Chief Complaint  Patient presents with   Fever    Daniel Hamilton is a 40 y.o. male.  The history is provided by the patient. The history is limited by a language barrier.  Fever Max temp prior to arrival:  102 Temp source:  Oral Severity:  Moderate Onset quality:  Gradual   Pt complains of a fever and chills.  Pt reports he became sick after being outside on Thursday   History reviewed. No pertinent past medical history.  Patient Active Problem List   Diagnosis Date Noted   Shoulder pain, acute 01/06/2019   Anxiety and depression 12/30/2018   Head trauma 12/30/2018   Concussion 12/30/2018   Acute post-traumatic headache, not intractable 12/30/2018   Tobacco use disorder 12/30/2018    Past Surgical History:  Procedure Laterality Date   SKIN GRAFT         Family History  Problem Relation Age of Onset   Diabetes Mother    Sarcoidosis Mother    Rheum arthritis Mother     Social History   Tobacco Use   Smoking status: Every Day    Types: Cigarettes   Smokeless tobacco: Never  Vaping Use   Vaping Use: Never used  Substance Use Topics   Alcohol use: Yes    Comment: occasional   Drug use: No    Home Medications Prior to Admission medications   Medication Sig Start Date End Date Taking? Authorizing Provider  amitriptyline (ELAVIL) 50 MG tablet Take 3 tablets (150 mg total) by mouth at bedtime. 02/03/19   Drema Dallas, DO  citalopram (CELEXA) 20 MG tablet Take 1 tablet (20 mg total) by mouth daily. 01/15/19   Storm Frisk, MD  cyclobenzaprine (FLEXERIL) 10 MG tablet Take 1 tablet (10 mg total) by mouth 3 (three) times daily as needed for muscle spasms. 12/30/18   Storm Frisk, MD  meloxicam (MOBIC) 15 MG tablet 1 po q d x 30 days 01/12/19   Cammy Copa, MD  Menthol, Topical Analgesic, (ICY HOT EX) Apply topically.     [provider]  ondansetron (ZOFRAN ODT) 4 MG disintegrating tablet Take 1 tablet (4 mg total) by mouth every 8 (eight) hours as needed. 12/30/18   Storm Frisk, MD  oxyCODONE (ROXICODONE) 5 MG immediate release tablet Take 1 tablet (5 mg total) by mouth every 4 (four) hours as needed for severe pain. 11/12/19   Alvira Monday, MD  SUMAtriptan (IMITREX) 100 MG tablet Take 1 tablet earliest onset of headache.  May repeat in 2 hours if headache persists or recurs.  Maximum 2 tablets in 24 hours. 02/03/19   Drema Dallas, DO    Allergies    Penicillins  Review of Systems   Review of Systems  Constitutional:  Positive for fever.  All other systems reviewed and are negative.  Physical Exam Updated Vital Signs BP (!) 125/91 (BP Location: Left Arm)   Pulse (!) 101   Temp (!) 102.5 F (39.2 C) (Oral)   Resp 18   Ht 6\' 2"  (1.88 m)   Wt 86.2 kg   SpO2 98%   BMI 24.39 kg/m   Physical Exam Vitals and nursing note reviewed.  Constitutional:      Appearance: He is well-developed.  HENT:     Head: Normocephalic and atraumatic.  Eyes:  Conjunctiva/sclera: Conjunctivae normal.  Cardiovascular:     Rate and Rhythm: Normal rate and regular rhythm.     Heart sounds: No murmur heard. Pulmonary:     Effort: Pulmonary effort is normal. No respiratory distress.     Breath sounds: Normal breath sounds.  Abdominal:     Palpations: Abdomen is soft.     Tenderness: There is no abdominal tenderness.  Musculoskeletal:     Cervical back: Neck supple.  Skin:    General: Skin is warm and dry.  Neurological:     Mental Status: He is alert.    ED Results / Procedures / Treatments   Labs (all labs ordered are listed, but only abnormal results are displayed) Labs Reviewed  SARS CORONAVIRUS 2 (TAT 6-24 HRS)    EKG None  Radiology No results found.  Procedures Procedures   Medications Ordered in ED Medications  acetaminophen (TYLENOL) tablet 650 mg (650 mg Oral Given  03/12/21 1713)    ED Course  I have reviewed the triage vital signs and the nursing notes.  Pertinent labs & imaging results that were available during my care of the patient were reviewed by me and considered in my medical decision making (see chart for details).    MDM Rules/Calculators/A&P                           MDM:  I suspect viral illness.  Pt counseled on fever management  Final Clinical Impression(s) / ED Diagnoses Final diagnoses:  Viral illness    Rx / DC Orders ED Discharge Orders     None     An After Visit Summary was printed and given to the patient.    Elson Areas, Cordelia Poche 03/14/21 Merrily Brittle    Alvira Monday, MD 03/15/21 [phone]

## 2021-03-16 ENCOUNTER — Emergency Department (HOSPITAL_BASED_OUTPATIENT_CLINIC_OR_DEPARTMENT_OTHER)
Admission: EM | Admit: 2021-03-16 | Discharge: 2021-03-16 | Disposition: A | Discharge status: Home / Self Care | Payer: 59 | Attending: Emergency Medicine | Admitting: Emergency Medicine

## 2021-03-16 ENCOUNTER — Emergency Department (HOSPITAL_BASED_OUTPATIENT_CLINIC_OR_DEPARTMENT_OTHER): Payer: 59

## 2021-03-16 ENCOUNTER — Other Ambulatory Visit: Payer: Self-pay

## 2021-03-16 DIAGNOSIS — R809 Proteinuria, unspecified: Secondary | ICD-10-CM | POA: Diagnosis not present

## 2021-03-16 DIAGNOSIS — Z79899 Other long term (current) drug therapy: Secondary | ICD-10-CM | POA: Diagnosis not present

## 2021-03-16 DIAGNOSIS — F17210 Nicotine dependence, cigarettes, uncomplicated: Secondary | ICD-10-CM | POA: Insufficient documentation

## 2021-03-16 DIAGNOSIS — B349 Viral infection, unspecified: Secondary | ICD-10-CM | POA: Diagnosis not present

## 2021-03-16 DIAGNOSIS — R509 Fever, unspecified: Secondary | ICD-10-CM | POA: Diagnosis present

## 2021-03-16 DIAGNOSIS — R319 Hematuria, unspecified: Secondary | ICD-10-CM | POA: Diagnosis not present

## 2021-03-16 LAB — CBC WITH DIFFERENTIAL/PLATELET
Abs Immature Granulocytes: 0.02 10*3/uL (ref 0.00–0.07)
Basophils Absolute: 0 10*3/uL (ref 0.0–0.1)
Basophils Relative: 0 %
Eosinophils Absolute: 0 10*3/uL (ref 0.0–0.5)
Eosinophils Relative: 0 %
HCT: 42.2 % (ref 39.0–52.0)
Hemoglobin: 15.4 g/dL (ref 13.0–17.0)
Immature Granulocytes: 0 %
Lymphocytes Relative: 12 %
Lymphs Abs: 0.8 10*3/uL (ref 0.7–4.0)
MCH: 33 pg (ref 26.0–34.0)
MCHC: 36.5 g/dL — ABNORMAL HIGH (ref 30.0–36.0)
MCV: 90.6 fL (ref 80.0–100.0)
Monocytes Absolute: 0.5 10*3/uL (ref 0.1–1.0)
Monocytes Relative: 7 %
Neutro Abs: 5.8 10*3/uL (ref 1.7–7.7)
Neutrophils Relative %: 81 %
Platelets: 118 10*3/uL — ABNORMAL LOW (ref 150–400)
RBC: 4.66 MIL/uL (ref 4.22–5.81)
RDW: 11.6 % (ref 11.5–15.5)
WBC: 7.1 10*3/uL (ref 4.0–10.5)
nRBC: 0 % (ref 0.0–0.2)

## 2021-03-16 LAB — URINALYSIS, ROUTINE W REFLEX MICROSCOPIC
Bilirubin Urine: NEGATIVE
Glucose, UA: NEGATIVE mg/dL
Ketones, ur: 40 mg/dL — AB
Leukocytes,Ua: NEGATIVE
Nitrite: NEGATIVE
Protein, ur: >300 mg/dL — AB
Specific Gravity, Urine: 1.025 (ref 1.005–1.030)
pH: 6.5 (ref 5.0–8.0)

## 2021-03-16 LAB — COMPREHENSIVE METABOLIC PANEL
ALT: 95 U/L — ABNORMAL HIGH (ref 0–44)
AST: 106 U/L — ABNORMAL HIGH (ref 15–41)
Albumin: 3.8 g/dL (ref 3.5–5.0)
Alkaline Phosphatase: 116 U/L (ref 38–126)
Anion gap: 11 (ref 5–15)
BUN: 13 mg/dL (ref 6–20)
CO2: 29 mmol/L (ref 22–32)
Calcium: 8.8 mg/dL — ABNORMAL LOW (ref 8.9–10.3)
Chloride: 92 mmol/L — ABNORMAL LOW (ref 98–111)
Creatinine, Ser: 1.19 mg/dL (ref 0.61–1.24)
GFR, Estimated: >60 mL/min (ref >60)
Glucose, Bld: 110 mg/dL — ABNORMAL HIGH (ref 70–99)
Potassium: 3.3 mmol/L — ABNORMAL LOW (ref 3.5–5.1)
Sodium: 132 mmol/L — ABNORMAL LOW (ref 135–145)
Total Bilirubin: 0.6 mg/dL (ref 0.3–1.2)
Total Protein: 7.9 g/dL (ref 6.5–8.1)

## 2021-03-16 LAB — URINALYSIS, MICROSCOPIC (REFLEX)

## 2021-03-16 MED ORDER — ACETAMINOPHEN 325 MG PO TABS
650.0000 mg | ORAL_TABLET | Freq: Once | ORAL | Status: AC
  Administered 2021-03-16: 650 mg via ORAL
  Filled 2021-03-16: qty 2
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
## 2021-03-16 NOTE — ED Triage Notes (Addendum)
Pt reports being seen here Sunday for fever, got work note through 9/14.  Pt reports having fever on arrival to work today, 102.3.  Pt was given 400 mg ibuprofen appx 40 min PTA to ED.  Pt also c/o poor PO intake. Denies any other complaints.

## 2021-03-16 NOTE — Discharge Instructions (Addendum)
Please follow-up with your primary care doctor.  On your blood work today we noticed your liver enzymes were slightly elevated and you had some microscopic blood and protein in your urine sample.  Please have your liver enzymes and urinalysis repeated sometime in the next couple weeks.  Your primary doctor can help make this arrangement.  If over the next few days you continue to have fevers, you develop any chest pain, difficulty breathing, abdominal pain, vomiting or other new concerning symptom, please return to ER for reassessment.

## 2021-04-18 ENCOUNTER — Encounter: Payer: Self-pay | Admitting: Registered Nurse

## 2021-04-18 ENCOUNTER — Ambulatory Visit: Payer: 59 | Admitting: Registered Nurse

## 2021-04-18 ENCOUNTER — Other Ambulatory Visit: Payer: Self-pay

## 2021-04-18 VITALS — BP 128/87 | HR 72 | Temp 98.3°F | Resp 17 | Ht 74.0 in | Wt 189.0 lb

## 2021-04-18 DIAGNOSIS — Z13220 Encounter for screening for lipoid disorders: Secondary | ICD-10-CM

## 2021-04-18 DIAGNOSIS — Z1159 Encounter for screening for other viral diseases: Secondary | ICD-10-CM | POA: Diagnosis not present

## 2021-04-18 DIAGNOSIS — Z13228 Encounter for screening for other metabolic disorders: Secondary | ICD-10-CM

## 2021-04-18 DIAGNOSIS — R0981 Nasal congestion: Secondary | ICD-10-CM

## 2021-04-18 DIAGNOSIS — Z130 Encounter for screening for diseases of the blood and blood-forming organs and certain disorders involving the immune mechanism: Secondary | ICD-10-CM

## 2021-04-18 DIAGNOSIS — Z1329 Encounter for screening for other suspected endocrine disorder: Secondary | ICD-10-CM

## 2021-04-18 DIAGNOSIS — R748 Abnormal levels of other serum enzymes: Secondary | ICD-10-CM | POA: Diagnosis not present

## 2021-04-18 DIAGNOSIS — R809 Proteinuria, unspecified: Secondary | ICD-10-CM

## 2021-04-18 MED ORDER — CETIRIZINE HCL 10 MG PO TABS: 10.0000 mg | ORAL_TABLET | Freq: Every day | ORAL | 11 refills | Status: AC

## 2021-04-18 MED ORDER — AZELASTINE HCL 0.1 % NA SOLN: 1.0000 | Freq: Two times a day (BID) | NASAL | 12 refills | Status: AC

## 2021-04-18 NOTE — Progress Notes (Signed)
New Patient Office Visit  Subjective:  Patient ID: Daniel Hamilton, male    DOB: 22-Oct-1980  Age: 40 y.o. MRN: [id number]  CC:  Chief Complaint  Patient presents with   New Patient (Initial Visit)    Patient states he is here to establish care and discuss labs     HPI Daniel Hamilton presents for visit to est care  Recent viral illness - unclear pathogen Caused flu like symptoms for a few days Seen in ER - noted to have elevated liver enzymes, abnormal urinalysis Wants to recheck on these today.  Does note nasal congestion Ongoing Hx of injury to nose - worse since that time. Sinus pressure as well Has used benadryl in past with some relief.  Histories reviewed and updated with patient.   Of note, he will be starting with the [employer] soon.  History reviewed. No pertinent past medical history.  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 He lives with his mother at times,  and his girlfriend and his son at other times in a 2nd floor apartment. He drinks an occasional soda, though mostly drinks Gatorade. He is unable to exercise.       Social Determinants of Health   Financial Resource Strain: Not on file  Food Insecurity: Not on file  Transportation Needs: Not on file  Physical Activity: Not on file  Stress: Not on file  Social Connections: Not on file  Intimate Partner Violence: Not on file    ROS Review of Systems  Constitutional: Negative.   HENT: Negative.    Eyes: Negative.   Respiratory: Negative.    Cardiovascular: Negative.   Gastrointestinal: Negative.   Genitourinary: Negative.   Musculoskeletal: Negative.   Skin: Negative.   Neurological: Negative.   Psychiatric/Behavioral: Negative.    All other systems reviewed and are negative.  Objective:   Today's Vitals: BP 128/87   Pulse 72   Temp 98.3 F (36.8 C) (Temporal)   Resp 17   Ht 6\' 2"  (1.88 m)   Wt 189 lb (85.7 kg)   BMI 24.27 kg/m   Physical Exam Vitals and nursing note reviewed.  Constitutional:      Appearance: Normal appearance.  Cardiovascular:     Rate and Rhythm: Normal rate and regular rhythm.     Pulses: Normal pulses.     Heart sounds: Normal heart sounds. No murmur heard.   No friction rub. No gallop.  Pulmonary:     Effort: Pulmonary  effort is normal. No respiratory distress.     Breath sounds: Normal breath sounds. No stridor. No wheezing, rhonchi or rales.  Neurological:     General: No focal deficit present.     Mental Status: He is alert. Mental status is at baseline.  Psychiatric:        Mood and Affect: Mood normal.        Behavior: Behavior normal.        Thought Content: Thought content normal.        Judgment: Judgment normal.    Assessment & Plan:   Problem List Items Addressed This Visit   None Visit Diagnoses     Nasal congestion    -  Primary   Relevant Medications   cetirizine (ZYRTEC) 10 MG tablet   azelastine (ASTELIN) 0.1 % nasal spray   Encounter for screening for other viral diseases       Relevant Orders   HIV antibody (with reflex)   Hepatitis C antibody   Screening for  endocrine, metabolic and immunity disorder       Relevant Orders   CBC with Differential/Platelet   Hemoglobin A1c   TSH   Lipid screening       Relevant Orders   Lipid panel   Elevated liver enzymes       Relevant Orders   Comprehensive metabolic panel   Proteinuria, unspecified type       Relevant Orders   Urinalysis, Routine w reflex microscopic       Outpatient Encounter Medications as of 04/18/2021  Medication Sig   azelastine (ASTELIN) 0.1 % nasal spray Place 1 spray into both nostrils 2 (two) times daily. Use in each nostril as directed   cetirizine (ZYRTEC) 10 MG tablet Take 1 tablet (10 mg total) by mouth daily.   [DISCONTINUED] amitriptyline (ELAVIL) 50 MG tablet Take 3 tablets (150 mg total) by mouth at bedtime.   [DISCONTINUED] citalopram (CELEXA) 20 MG tablet Take 1 tablet (20 mg total) by mouth daily.   [DISCONTINUED] cyclobenzaprine (FLEXERIL) 10 MG tablet Take 1 tablet (10 mg total) by mouth 3 (three) times daily as needed for muscle spasms.   [DISCONTINUED] meloxicam (MOBIC) 15 MG tablet 1 po q d x 30 days   [DISCONTINUED] Menthol, Topical Analgesic, (ICY HOT EX) Apply topically.   [DISCONTINUED] ondansetron (ZOFRAN ODT) 4 MG disintegrating tablet Take 1 tablet (4 mg total) by mouth every 8 (eight) hours as needed.   [DISCONTINUED] oxyCODONE (ROXICODONE) 5 MG immediate release tablet Take 1 tablet (5 mg total) by mouth every 4 (four) hours as needed for severe pain.   [DISCONTINUED] SUMAtriptan (IMITREX) 100 MG tablet Take 1 tablet earliest onset of headache.  May repeat in 2 hours if headache persists or recurs.  Maximum 2 tablets in 24 hours.   No facility-administered encounter medications on file as of 04/18/2021.    Follow-up: Return in about 1 year (around 04/18/2022) for CPE and labs.   PLAN Labs collected. Will follow up with the patient as warranted. Return in 2023 for CPE and labs Azelastine and cetirizine for suspected allergies. If worsening or  failing to improve, can send to ENT. Patient encouraged to call clinic with any questions, comments, or concerns.  Janeece Agee, NP

## 2021-04-18 NOTE — Patient Instructions (Addendum)
Mr. Dickison -  Randie Heinz to meet you  Labs will be back tomorrow.  See you in a year for a physical and labs, sooner if you need anything.  Thank you  Rich     If you have lab work done today you will be contacted with your lab results within the next 2 weeks.  If you have not heard from [place] then please contact us. The fastest way to get your results is to register for My Chart.   IF you received an x-ray today, you will receive an invoice from [facility]. Please contact [facility] at [phone] with questions or concerns regarding your invoice.   IF you received labwork today, you will receive an invoice from [facility]. Please contact [facility] at [phone] with questions or concerns regarding your invoice.   Our billing staff will not be able to assist you with questions regarding bills from these companies.  You will be contacted with the lab results as soon as they are available. The fastest way to get your results is to activate your My Chart account. Instructions are located on the last page of this paperwork. If you have not heard from [place] regarding the results in 2 weeks, please contact this office.

## 2021-04-19 LAB — CBC WITH DIFFERENTIAL/PLATELET
Basophils Absolute: 0 10*3/uL (ref 0.0–0.1)
Basophils Relative: 0.8 % (ref 0.0–3.0)
Eosinophils Absolute: 0.1 10*3/uL (ref 0.0–0.7)
Eosinophils Relative: 1.8 % (ref 0.0–5.0)
HCT: 40.5 % (ref 39.0–52.0)
Hemoglobin: 13.5 g/dL (ref 13.0–17.0)
Lymphocytes Relative: 25.5 % (ref 12.0–46.0)
Lymphs Abs: 1.6 10*3/uL (ref 0.7–4.0)
MCHC: 33.3 g/dL (ref 30.0–36.0)
MCV: 99.9 fl (ref 78.0–100.0)
Monocytes Absolute: 0.5 10*3/uL (ref 0.1–1.0)
Monocytes Relative: 7 % (ref 3.0–12.0)
Neutro Abs: 4.2 10*3/uL (ref 1.4–7.7)
Neutrophils Relative %: 64.9 % (ref 43.0–77.0)
Platelets: 256 10*3/uL (ref 150.0–400.0)
RBC: 4.05 Mil/uL — ABNORMAL LOW (ref 4.22–5.81)
RDW: 14 % (ref 11.5–15.5)
WBC: 6.4 10*3/uL (ref 4.0–10.5)

## 2021-04-19 LAB — COMPREHENSIVE METABOLIC PANEL
ALT: 22 U/L (ref 0–53)
AST: 18 U/L (ref 0–37)
Albumin: 4.7 g/dL (ref 3.5–5.2)
Alkaline Phosphatase: 83 U/L (ref 39–117)
BUN: 7 mg/dL (ref 6–23)
CO2: 30 mEq/L (ref 19–32)
Calcium: 10.2 mg/dL (ref 8.4–10.5)
Chloride: 100 mEq/L (ref 96–112)
Creatinine, Ser: 0.97 mg/dL (ref 0.40–1.50)
GFR: 99.33 mL/min (ref >60.00)
Glucose, Bld: 88 mg/dL (ref 70–99)
Potassium: 4.3 mEq/L (ref 3.5–5.1)
Sodium: 140 mEq/L (ref 135–145)
Total Bilirubin: 0.6 mg/dL (ref 0.2–1.2)
Total Protein: 7.3 g/dL (ref 6.0–8.3)

## 2021-04-19 LAB — LIPID PANEL
Cholesterol: 183 mg/dL (ref 0–200)
HDL: 60 mg/dL (ref >39.00)
LDL Cholesterol: 111 mg/dL — ABNORMAL HIGH (ref 0–99)
NonHDL: 123.39
Total CHOL/HDL Ratio: 3
Triglycerides: 60 mg/dL (ref 0.0–149.0)
VLDL: 12 mg/dL (ref 0.0–40.0)

## 2021-04-19 LAB — HEMOGLOBIN A1C: Hgb A1c MFr Bld: 5.5 % (ref 4.6–6.5)

## 2021-04-19 LAB — URINALYSIS, ROUTINE W REFLEX MICROSCOPIC
Bilirubin Urine: NEGATIVE
Hgb urine dipstick: NEGATIVE
Ketones, ur: NEGATIVE
Leukocytes,Ua: NEGATIVE
Nitrite: NEGATIVE
RBC / HPF: NONE SEEN (ref 0–?)
Specific Gravity, Urine: 1.02 (ref 1.000–1.030)
Total Protein, Urine: NEGATIVE
Urine Glucose: NEGATIVE
Urobilinogen, UA: 0.2 (ref 0.0–1.0)
pH: 7 (ref 5.0–8.0)

## 2021-04-19 LAB — TSH: TSH: 0.93 u[IU]/mL (ref 0.35–5.50)

## 2021-04-20 LAB — HEPATITIS C ANTIBODY
Hepatitis C Ab: NONREACTIVE
SIGNAL TO CUT-OFF: 0.06 (ref <1.00)

## 2021-04-20 LAB — HIV ANTIBODY (ROUTINE TESTING W REFLEX): HIV 1&2 Ab, 4th Generation: NONREACTIVE

## 2022-02-06 ENCOUNTER — Ambulatory Visit: Payer: 59 | Admitting: Family Medicine

## 2022-02-09 ENCOUNTER — Ambulatory Visit: Payer: 59 | Admitting: Family Medicine

## 2022-08-08 ENCOUNTER — Other Ambulatory Visit: Payer: Self-pay

## 2022-08-08 ENCOUNTER — Encounter (HOSPITAL_BASED_OUTPATIENT_CLINIC_OR_DEPARTMENT_OTHER): Payer: Self-pay | Admitting: Emergency Medicine

## 2022-08-08 DIAGNOSIS — Z87891 Personal history of nicotine dependence: Secondary | ICD-10-CM | POA: Insufficient documentation

## 2022-08-08 DIAGNOSIS — Z20822 Contact with and (suspected) exposure to covid-19: Secondary | ICD-10-CM | POA: Diagnosis not present

## 2022-08-08 DIAGNOSIS — R051 Acute cough: Secondary | ICD-10-CM | POA: Insufficient documentation

## 2022-08-08 DIAGNOSIS — R059 Cough, unspecified: Secondary | ICD-10-CM | POA: Diagnosis present

## 2022-08-08 NOTE — ED Triage Notes (Signed)
  Patient comes in with generalized body aches and cough that has been going on for about a week.  Patient states today was the first day he was able to get out of bed.  Endorses nonproductive cough, congestion, and sinus pressure.  Has been taking nyquil/dayquil with last dose Tuesday night.  Pain 10/10, when coughing.

## 2022-08-09 ENCOUNTER — Emergency Department (HOSPITAL_BASED_OUTPATIENT_CLINIC_OR_DEPARTMENT_OTHER): Payer: 59 | Admitting: Radiology

## 2022-08-09 ENCOUNTER — Emergency Department (HOSPITAL_BASED_OUTPATIENT_CLINIC_OR_DEPARTMENT_OTHER)
Admission: EM | Admit: 2022-08-09 | Discharge: 2022-08-09 | Disposition: A | Discharge status: Home / Self Care | Payer: 59 | Attending: Emergency Medicine | Admitting: Emergency Medicine

## 2022-08-09 DIAGNOSIS — R051 Acute cough: Secondary | ICD-10-CM

## 2022-08-09 LAB — RESP PANEL BY RT-PCR (RSV, FLU A&B, COVID)  RVPGX2
Influenza A by PCR: NEGATIVE
Influenza B by PCR: NEGATIVE
Resp Syncytial Virus by PCR: NEGATIVE
SARS Coronavirus 2 by RT PCR: NEGATIVE

## 2022-08-09 MED ORDER — DOXYCYCLINE HYCLATE 100 MG PO CAPS: 100.0000 mg | ORAL_CAPSULE | Freq: Two times a day (BID) | ORAL | 0 refills | Status: DC

## 2022-08-09 MED ORDER — DOXYCYCLINE HYCLATE 100 MG PO CAPS: 100.0000 mg | ORAL_CAPSULE | Freq: Two times a day (BID) | ORAL | 0 refills | Status: AC

## 2022-08-09 NOTE — Discharge Instructions (Signed)
You were evaluated in the Emergency Department and after careful evaluation, we did not find any emergent condition requiring admission or further testing in the hospital.  Your exam/testing today is overall reassuring.  Symptoms may be due to pneumonia.  Take the doxycycline antibiotic as directed.  Plenty of fluids and rest.  Please return to the Emergency Department if you experience any worsening of your condition.   Thank you for allowing [place] to be a part of your care.

## 2022-08-09 NOTE — ED Notes (Signed)
Pt agreeable with d/c plan as discussed by provider- this nurse has verbally reinforced d/c instructions and provided pt with written copy.  Pt acknowledges verbal understanding and denies any addl questions, concerns, needs- pt ambulatory independently at d/c with steady gait- vitals stable/ no acute changes-distress noted prior to departure

## 2022-08-09 NOTE — ED Provider Notes (Signed)
[facility] Emergency Department Provider Note MRN:  [id number]  Arrival date & time: 08/09/22     Chief Complaint   Generalized Body Aches and Cough   History of Present Illness   Daniel Hamilton is a 42 y.o. year-old male with no pertinent past medical history presenting to the ED with chief complaint of bodyaches and cough.  1 week of persistent cough, body aches.  Productive cough.  Difficult to lay flat due to the cough.  Review of Systems  A thorough review of systems was obtained and all systems are negative except as noted in the HPI and PMH.   Patient's Health History   History reviewed. No pertinent past medical history.  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 He lives with his mother at times,  and his girlfriend and his son at other times in a 2nd floor apartment. He drinks an occasional soda, though mostly drinks Gatorade. He is unable to exercise.      Social Determinants of Health   Financial Resource Strain: Not on file  Food Insecurity: Not on file  Transportation Needs: Not on file  Physical Activity: Not on file  Stress: Not  on file  Social Connections: Not on file  Intimate Partner Violence: Not on file     Physical Exam   Vitals:   08/08/22 2357  BP: 109/67  Pulse: 68  Resp: 18  Temp: 97.9 F (36.6 C)  SpO2: 97%    CONSTITUTIONAL: Well-appearing, NAD NEURO/PSYCH:  Alert and oriented x 3, no focal deficits EYES:  eyes equal and reactive ENT/NECK:  no LAD, no JVD CARDIO: Regular rate, well-perfused, normal S1 and S2 PULM:  CTAB no wheezing or rhonchi GI/GU:  non-distended, non-tender MSK/SPINE:  No gross deformities, no edema SKIN:  no rash, atraumatic   *Additional and/or pertinent findings included in MDM below  Diagnostic and Interventional Summary    EKG Interpretation  Date/Time:    Ventricular Rate:    PR Interval:    QRS Duration:   QT Interval:    QTC Calculation:   R Axis:     Text Interpretation:         Labs Reviewed  RESP PANEL BY RT-PCR (RSV, FLU A&B, COVID)  [id number]    DG Chest 2 View    (Results Pending)    Medications - No data to display   Procedures  /  Critical Care Procedures  ED Course and Medical Decision Making  Initial Impression and  Ddx Suspicious for community-acquired pneumonia versus viral illness.  Diminished breath sounds throughout.  Will x-ray to exclude signs of pneumothorax or other structural issue such as neoplasm, enlarged heart  Past medical/surgical history that increases complexity of ED encounter: None  Interpretation of Diagnostics I personally reviewed the Chest Xray and my interpretation is as follows: No obvious lobar opacity or pneumothorax    Patient Reassessment and Ultimate Disposition/Management     Discharge.  Clinically suspicious for bacterial pneumonia.  Patient management required discussion with the following services or consulting groups:  None  Complexity of Problems Addressed Acute complicated illness or Injury  Additional Data Reviewed and Analyzed Further history obtained from: None  Additional  Factors Impacting ED Encounter Risk Prescriptions  Jayceon Troy. [place] Small, [place] [email or web address]  Final Clinical Impressions(s) / ED Diagnoses     ICD-10-CM   1. Acute cough  R05.1       ED Discharge Orders          Ordered    doxycycline (VIBRAMYCIN) 100 MG capsule  2 times daily        08/09/22 0129             Discharge Instructions Discussed with and Provided to Patient:    Discharge Instructions      You were evaluated in the Emergency Department and after careful evaluation, we did not find any emergent condition requiring admission or further testing in the hospital.  Your exam/testing today is overall reassuring.  Symptoms may be due to pneumonia.  Take the doxycycline antibiotic as directed.  Plenty of fluids and rest.  Please return to the Emergency Department if you experience any worsening of your condition.   Thank you for allowing [place] to be a part of your care.      Maudie Flakes, MD 08/09/22 [phone]

## 2022-12-20 ENCOUNTER — Emergency Department (HOSPITAL_BASED_OUTPATIENT_CLINIC_OR_DEPARTMENT_OTHER): Payer: 59

## 2022-12-20 ENCOUNTER — Other Ambulatory Visit: Payer: Self-pay

## 2022-12-20 ENCOUNTER — Emergency Department (HOSPITAL_BASED_OUTPATIENT_CLINIC_OR_DEPARTMENT_OTHER)
Admission: EM | Admit: 2022-12-20 | Discharge: 2022-12-20 | Disposition: A | Discharge status: Home / Self Care | Payer: 59 | Attending: Emergency Medicine | Admitting: Emergency Medicine

## 2022-12-20 DIAGNOSIS — M25511 Pain in right shoulder: Secondary | ICD-10-CM | POA: Insufficient documentation

## 2022-12-20 DIAGNOSIS — Y92410 Unspecified street and highway as the place of occurrence of the external cause: Secondary | ICD-10-CM | POA: Diagnosis not present

## 2022-12-20 DIAGNOSIS — R519 Headache, unspecified: Secondary | ICD-10-CM | POA: Diagnosis present

## 2022-12-20 MED ORDER — ACETAMINOPHEN 500 MG PO TABS
1000.0000 mg | ORAL_TABLET | Freq: Once | ORAL | Status: AC
  Administered 2022-12-20: 1000 mg via ORAL
  Filled 2022-12-20: qty 2

## 2022-12-20 MED ORDER — METHOCARBAMOL 500 MG PO TABS: 500.0000 mg | ORAL_TABLET | Freq: Two times a day (BID) | ORAL | 0 refills | Status: AC
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
## 2022-12-20 NOTE — Discharge Instructions (Signed)
It was a pleasure taking care of you today!  You may feel more sore in the morning. You are prescribed Robaxin (muscle relaxer). Do not drive or operate heavy machinery while taking the muscle relaxer as it can make you sleepy/drowsy. You may take over the counter 600 mg Ibuprofen every 6 hours and alternate with 500 mg Tylenol every 6 hours as needed for pain for no more than 7 days. You may apply ice or heat to affected area for up to 15 minutes at a time. Ensure to place a barrier between your skin and the ice/heat. Return to the Emergency Department if you are experiencing increasing/worsening symptoms.
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
## 2023-06-27 ENCOUNTER — Other Ambulatory Visit: Payer: Self-pay

## 2023-06-27 ENCOUNTER — Emergency Department (HOSPITAL_COMMUNITY): Payer: 59

## 2023-06-27 ENCOUNTER — Emergency Department (HOSPITAL_COMMUNITY)
Admission: EM | Admit: 2023-06-27 | Discharge: 2023-06-27 | Disposition: A | Discharge status: Home / Self Care | Payer: 59 | Attending: Emergency Medicine | Admitting: Emergency Medicine

## 2023-06-27 DIAGNOSIS — R079 Chest pain, unspecified: Secondary | ICD-10-CM | POA: Diagnosis present

## 2023-06-27 DIAGNOSIS — R0602 Shortness of breath: Secondary | ICD-10-CM | POA: Diagnosis not present

## 2023-06-27 DIAGNOSIS — D72829 Elevated white blood cell count, unspecified: Secondary | ICD-10-CM | POA: Diagnosis not present

## 2023-06-27 LAB — BASIC METABOLIC PANEL
Anion gap: 8 (ref 5–15)
BUN: 17 mg/dL (ref 6–20)
CO2: 26 mmol/L (ref 22–32)
Calcium: 9.5 mg/dL (ref 8.9–10.3)
Chloride: 104 mmol/L (ref 98–111)
Creatinine, Ser: 1.13 mg/dL (ref 0.61–1.24)
GFR, Estimated: >60 mL/min (ref >60)
Glucose, Bld: 102 mg/dL — ABNORMAL HIGH (ref 70–99)
Potassium: 3.6 mmol/L (ref 3.5–5.1)
Sodium: 138 mmol/L (ref 135–145)

## 2023-06-27 LAB — CBC
HCT: 44.3 % (ref 39.0–52.0)
Hemoglobin: 15.6 g/dL (ref 13.0–17.0)
MCH: 34 pg (ref 26.0–34.0)
MCHC: 35.2 g/dL (ref 30.0–36.0)
MCV: 96.5 fL (ref 80.0–100.0)
Platelets: 238 10*3/uL (ref 150–400)
RBC: 4.59 MIL/uL (ref 4.22–5.81)
RDW: 11.5 % (ref 11.5–15.5)
WBC: 12.3 10*3/uL — ABNORMAL HIGH (ref 4.0–10.5)
nRBC: 0 % (ref 0.0–0.2)

## 2023-06-27 LAB — TROPONIN I (HIGH SENSITIVITY)
Troponin I (High Sensitivity): 3 ng/L (ref <18)
Troponin I (High Sensitivity): 3 ng/L (ref <18)

## 2023-06-27 NOTE — ED Triage Notes (Signed)
Patient BIB EMS c/o anxiety. Patient denies SI/HI. Patient report want to be seen by Psychiatrist.

## 2023-06-27 NOTE — ED Provider Notes (Cosign Needed)
[place] [facility] AT [facility] Provider Note   CSN: [id number] Arrival date & time: 06/27/23  1624     History  Chief Complaint  Patient presents with   Mental Health Problem    Daniel Hamilton is a 42 y.o. male with medical history of tobacco use disorder, acute posttraumatic headaches, concussions, anxiety depression.  The patient presents to the ED for evaluation of chest pain and shortness of breath which she attributes to anxiety.  He reports that he is going through significant life stressors at home with his baby's mother.  Reports that these life stressors are causing him a lot of anxiety currently.  Reports that he recently began smoking over the last 4 days because of the stress.  States that this morning he went outside to have a cigarette, took a puff and became very short of breath and started having chest pain that radiated to his left arm.  He also was endorsing lightheadedness, dizziness and weakness that occurred at this time.  Denies history of cardiac events.  Denies blood thinning medication.  Reports currently his chest pain is 3 out of 10 and is worse with exertion.  Denies any recent surgery or travel, history of blood clots, leg swelling, exogenous hormone use.  He denies any nausea, vomiting, abdominal pain, fevers at home.   Mental Health Problem Associated symptoms: chest pain        Home Medications Prior to Admission medications   Medication Sig Start Date End Date Taking? Authorizing Provider  azelastine (ASTELIN) 0.1 % nasal spray Place 1 spray into both nostrils 2 (two) times daily. Use in each nostril as directed 04/18/21   Janeece Agee, NP  cetirizine (ZYRTEC) 10 MG tablet Take 1 tablet (10 mg total) by mouth daily. 04/18/21   Janeece Agee, NP  methocarbamol (ROBAXIN) 500 MG tablet Take 1 tablet (500 mg total) by mouth 2 (two) times daily. 12/20/22   Blue, Soijett A, PA-C      Allergies    Penicillins     Review of Systems   Review of Systems  Respiratory:  Positive for shortness of breath.   Cardiovascular:  Positive for chest pain.  Neurological:  Positive for dizziness, weakness and light-headedness.  All other systems reviewed and are negative.   Physical Exam Updated Vital Signs BP 137/84   Pulse 80   Temp 98 F (36.7 C)   Resp 16   Ht 6\' 3"  (1.905 m)   Wt 97 kg   SpO2 99%   BMI 26.73 kg/m  Physical Exam Vitals and nursing note reviewed.  Constitutional:      General: He is not in acute distress.    Appearance: Normal appearance. He is not ill-appearing, toxic-appearing or diaphoretic.  HENT:     Head: Normocephalic and atraumatic.     Nose: Nose normal.     Mouth/Throat:     Mouth: Mucous membranes are moist.     Pharynx: Oropharynx is clear.  Eyes:     Extraocular Movements: Extraocular movements intact.     Conjunctiva/sclera: Conjunctivae normal.     Pupils: Pupils are equal, round, and reactive to light.  Cardiovascular:     Rate and Rhythm: Normal rate and regular rhythm.  Pulmonary:     Effort: Pulmonary effort is normal.     Breath sounds: Normal breath sounds. No wheezing.  Abdominal:     General: Abdomen is flat. Bowel sounds are normal.     Palpations: Abdomen is  soft.     Tenderness: There is no abdominal tenderness.  Musculoskeletal:     Cervical back: Normal range of motion and neck supple. No tenderness.  Skin:    General: Skin is warm and dry.     Capillary Refill: Capillary refill takes less than 2 seconds.  Neurological:     Mental Status: He is alert and oriented to person, place, and time.     ED Results / Procedures / Treatments   Labs (all labs ordered are listed, but only abnormal results are displayed) Labs Reviewed  BASIC METABOLIC PANEL - Abnormal; Notable for the following components:      Result Value   Glucose, Bld 102 (*)    All other components within normal limits  CBC - Abnormal; Notable for the following  components:   WBC 12.3 (*)    All other components within normal limits  TROPONIN I (HIGH SENSITIVITY)  TROPONIN I (HIGH SENSITIVITY)    EKG None  Radiology DG Chest Port 1 View Result Date: 06/27/2023 CLINICAL DATA:  Shortness of breath and chest pain EXAM: PORTABLE CHEST 1 VIEW COMPARISON:  Chest radiograph dated 08/09/2022 FINDINGS: Normal lung volumes. No focal consolidations. Right costophrenic angle is not entirely included within the field of view. No definite pleural effusion or pneumothorax. The heart size and mediastinal contours are within normal limits. No acute osseous abnormality. IMPRESSION: No active disease. Electronically Signed   By: Agustin Cree M.D.   On: 06/27/2023 18:05    Procedures Procedures   Medications Ordered in ED Medications - No data to display  ED Course/ Medical Decision Making/ A&P  Medical Decision Making Amount and/or Complexity of Data Reviewed Labs: ordered. Radiology: ordered.   42 year old male presents to ED for evaluation.  Please see HPI for further details.  On examination the patient is afebrile, nontachycardic.  His lung sounds are clear bilaterally and he is not hypoxic.  Abdomen is soft and compressible throughout.  Neurological examination at baseline.  No edema to bilateral lower extremities.  Overall nontoxic in appearance.  CBC with a slight leukocytosis of 12.3 however afebrile and nontachycardic.  Metabolic panel unremarkable.  Troponin is 3, 3 with delta.  Chest x-ray unremarkable.  EKG is nonischemic.   Patient advised of his reassuring workup.  He will be sent home at this time and also given information for the behavior [facility] urgent care.  Advised to follow-up with them.  Encouraged to return to the ED with any new or worsening symptoms.  He voiced understanding.  Stable to discharge.  Final Clinical Impression(s) / ED Diagnoses Final diagnoses:  Chest pain, unspecified type    Rx / DC Orders ED Discharge Orders      None         Al Decant, PA-C 06/27/23 2103

## 2023-06-27 NOTE — Discharge Instructions (Signed)
Please follow-up with behavioral urgent care for any kind of psychiatric needs.  Please follow-up with your PCP regarding your chest pain.  Please return to the ED with any return of chest pain or shortness of breath or increased symptoms.

## 2023-07-19 IMAGING — DX DG CHEST 2V
2 series · 2 of 2 positions shown · non-contrast
Comparison: 01/02/2013

CLINICAL DATA: Fever.  COVID negative

EXAM:
CHEST - 2 VIEW

[chest pa]
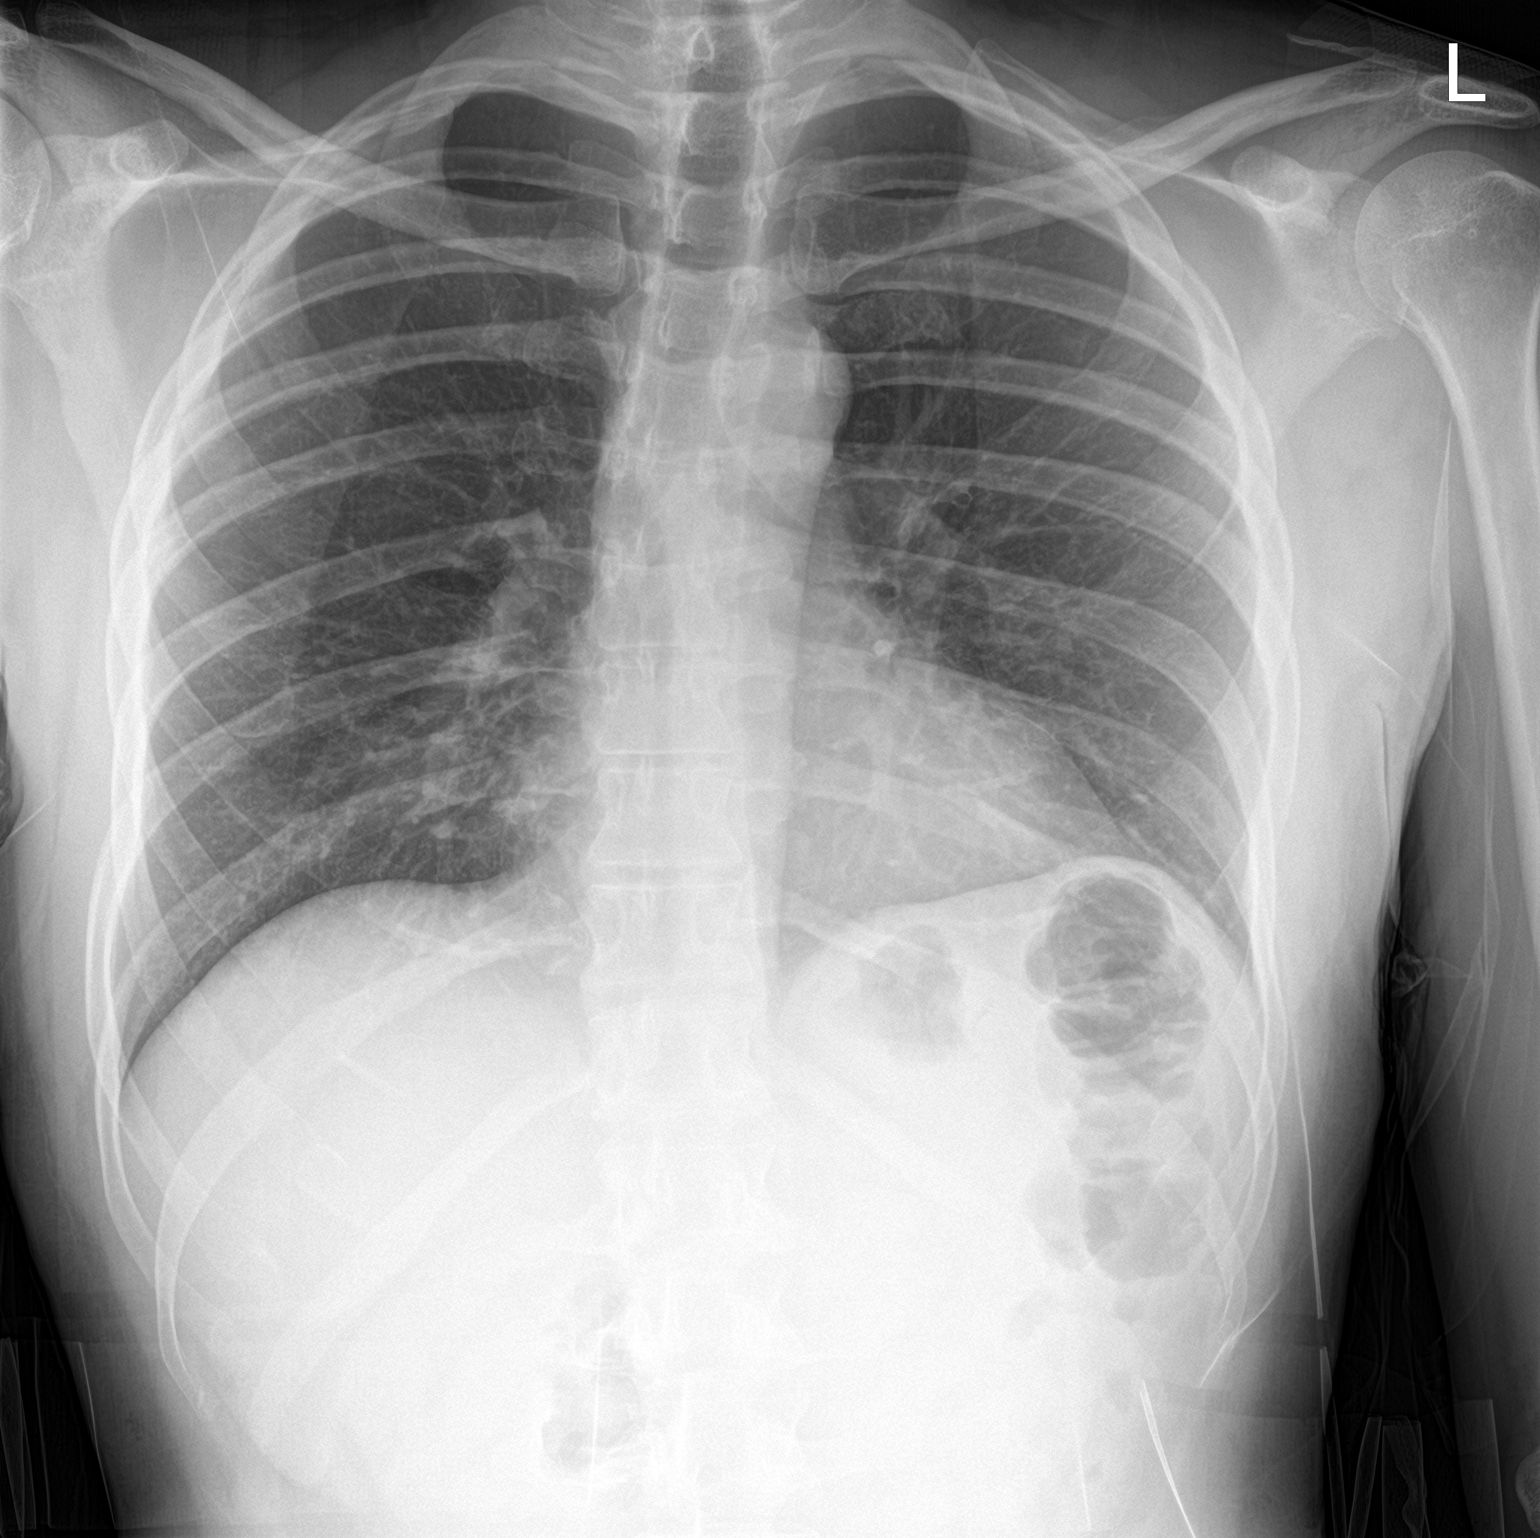

[chest lat]
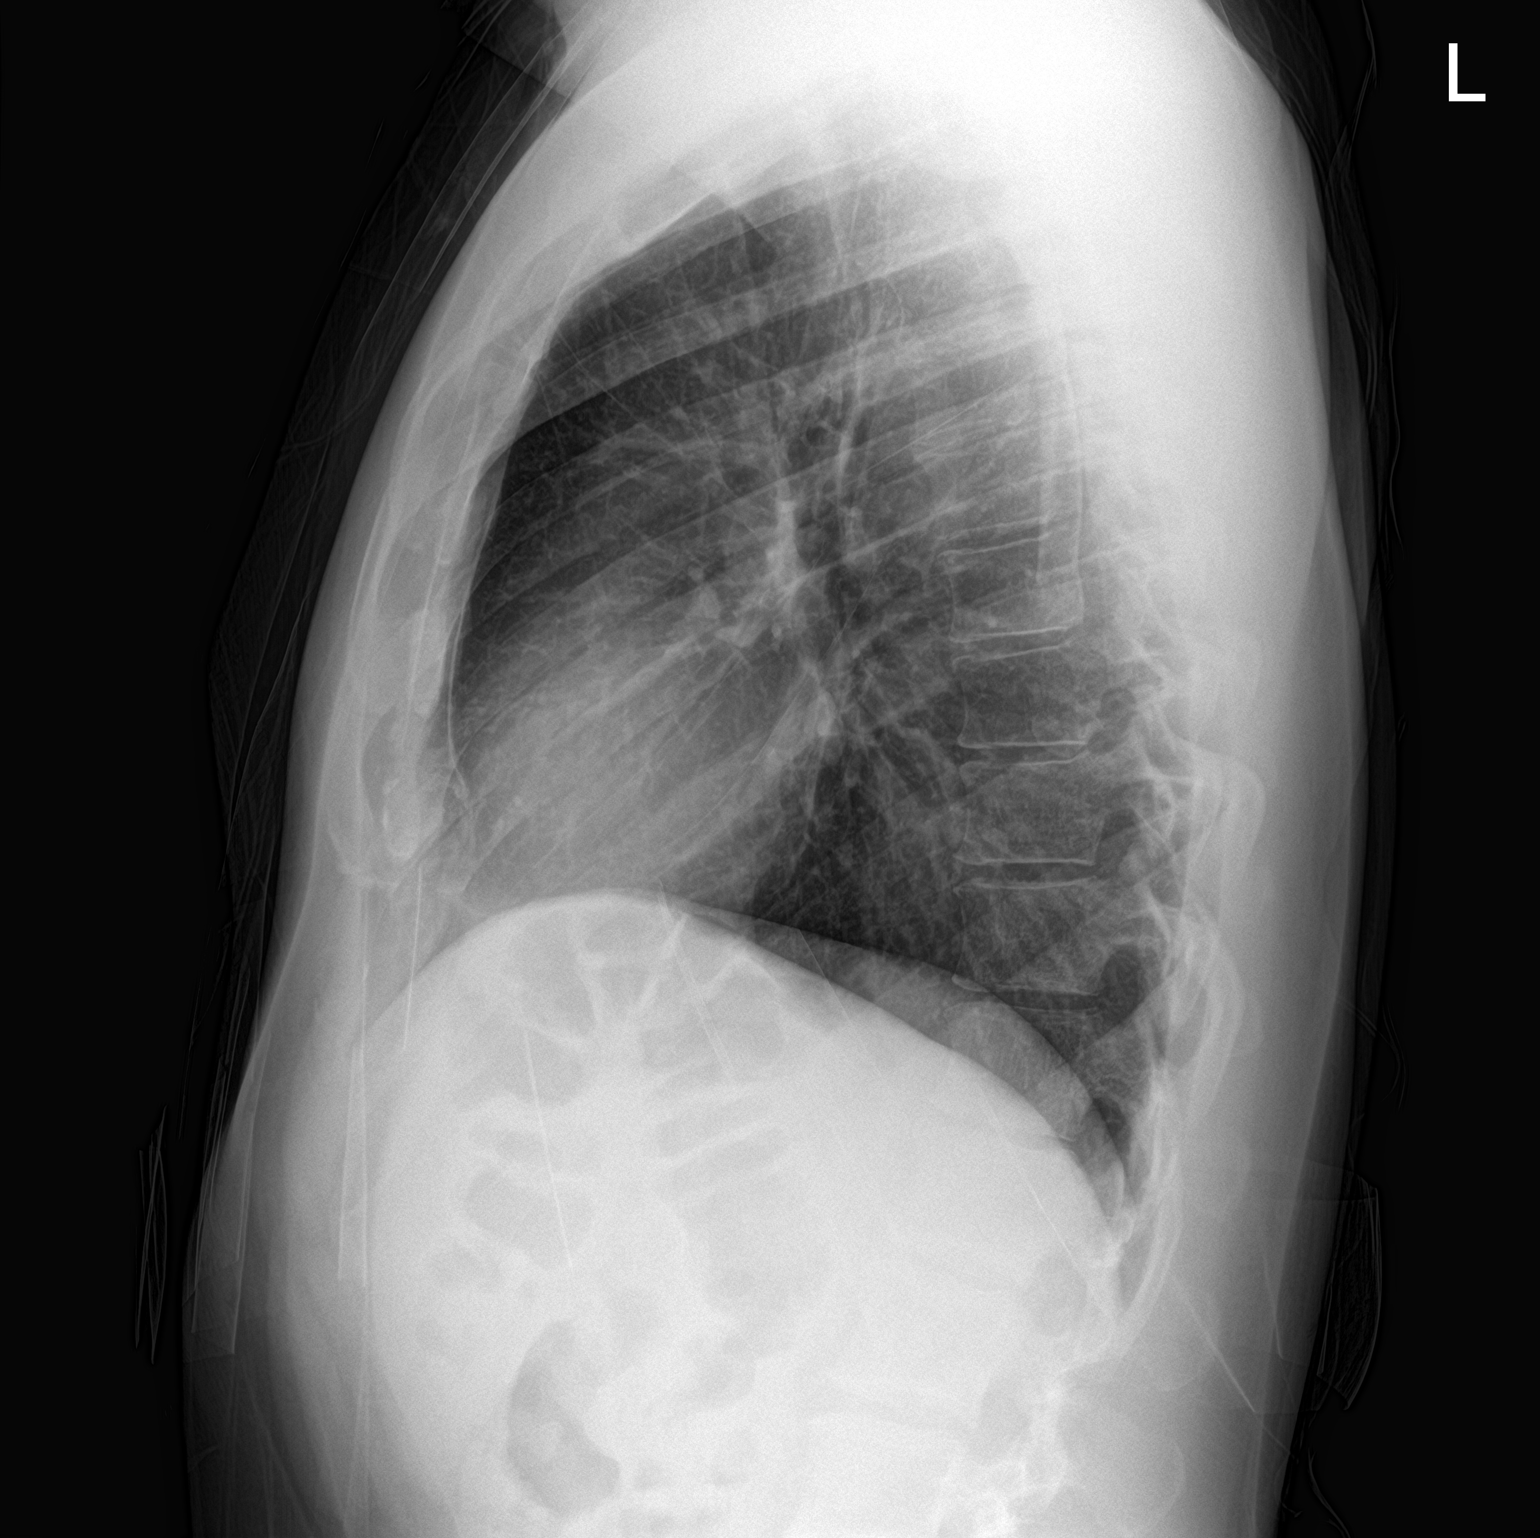

[2 of 2 positions shown; findings below may reference images not displayed]

FINDINGS: Normal heart size and mediastinal contours. No acute infiltrate or
edema. No effusion or pneumothorax. No acute osseous findings.
IMPRESSION: Negative chest.

## 2023-11-27 ENCOUNTER — Encounter (HOSPITAL_BASED_OUTPATIENT_CLINIC_OR_DEPARTMENT_OTHER): Payer: Self-pay | Admitting: Emergency Medicine

## 2023-11-27 ENCOUNTER — Emergency Department (HOSPITAL_BASED_OUTPATIENT_CLINIC_OR_DEPARTMENT_OTHER)
Admission: EM | Admit: 2023-11-27 | Discharge: 2023-11-27 | Disposition: A | Discharge status: Home / Self Care | Attending: Emergency Medicine | Admitting: Emergency Medicine

## 2023-11-27 ENCOUNTER — Other Ambulatory Visit: Payer: Self-pay

## 2023-11-27 DIAGNOSIS — R112 Nausea with vomiting, unspecified: Secondary | ICD-10-CM | POA: Diagnosis present

## 2023-11-27 DIAGNOSIS — A084 Viral intestinal infection, unspecified: Secondary | ICD-10-CM | POA: Diagnosis not present

## 2023-11-27 LAB — COMPREHENSIVE METABOLIC PANEL WITH GFR
ALT: 13 U/L (ref 0–44)
AST: 16 U/L (ref 15–41)
Albumin: 4.4 g/dL (ref 3.5–5.0)
Alkaline Phosphatase: 81 U/L (ref 38–126)
Anion gap: 8 (ref 5–15)
BUN: 12 mg/dL (ref 6–20)
CO2: 27 mmol/L (ref 22–32)
Calcium: 8.9 mg/dL (ref 8.9–10.3)
Chloride: 103 mmol/L (ref 98–111)
Creatinine, Ser: 1.09 mg/dL (ref 0.61–1.24)
GFR, Estimated: >60 mL/min (ref >60)
Glucose, Bld: 100 mg/dL — ABNORMAL HIGH (ref 70–99)
Potassium: 4.2 mmol/L (ref 3.5–5.1)
Sodium: 138 mmol/L (ref 135–145)
Total Bilirubin: 0.5 mg/dL (ref 0.0–1.2)
Total Protein: 7 g/dL (ref 6.5–8.1)

## 2023-11-27 LAB — CBC
HCT: 42.7 % (ref 39.0–52.0)
Hemoglobin: 14.9 g/dL (ref 13.0–17.0)
MCH: 33 pg (ref 26.0–34.0)
MCHC: 34.9 g/dL (ref 30.0–36.0)
MCV: 94.7 fL (ref 80.0–100.0)
Platelets: 214 10*3/uL (ref 150–400)
RBC: 4.51 MIL/uL (ref 4.22–5.81)
RDW: 11.7 % (ref 11.5–15.5)
WBC: 6.7 10*3/uL (ref 4.0–10.5)
nRBC: 0 % (ref 0.0–0.2)

## 2023-11-27 LAB — LIPASE, BLOOD: Lipase: 15 U/L (ref 11–51)

## 2023-11-27 MED ORDER — ONDANSETRON HCL 4 MG PO TABS: 4.0000 mg | ORAL_TABLET | Freq: Three times a day (TID) | ORAL | 0 refills | Status: AC | PRN

## 2023-11-27 MED ORDER — ONDANSETRON HCL 4 MG PO TABS
4.0000 mg | ORAL_TABLET | Freq: Once | ORAL | Status: AC
  Administered 2023-11-27: 4 mg via ORAL
  Filled 2023-11-27: qty 1

## 2023-11-27 NOTE — ED Triage Notes (Signed)
 Emesis x 1 day , abd discomfort . Diarrhea x 1 day , no fever .

## 2023-11-27 NOTE — ED Provider Notes (Signed)
 [place] [facility] AT [facility] Provider Note   CSN: [id number] Arrival date & time: 11/27/23  1511     History  Chief Complaint  Patient presents with   Emesis    Daniel Hamilton is a 43 y.o. male.  With no significant past medical history presents to ED for abdominal pain nausea vomiting diarrhea.  Patient has experienced abdominal pain which is generalized and describes as a cramping sensation all over along with nausea vomiting diarrhea for last 24 hours.  Multiple episodes of diarrhea which caused him to stay home from work today.  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  3 episodes of vomiting starting last night.  No bloody stools or hematemesis.  No fevers or chills.  Patient mentions his 15-year-old son recently recovered from similar GI illness   Emesis      Home Medications Prior to Admission medications   Medication Sig Start Date End Date Taking? Authorizing Provider  ondansetron  (ZOFRAN ) 4 MG tablet Take 1 tablet (4 mg total) by mouth every 8 (eight) hours as needed for up to 5 days for nausea or vomiting. 11/27/23 12/02/23 Yes Sallyanne Creamer, DO  azelastine  (ASTELIN ) 0.1 % nasal spray Place 1 spray into both nostrils 2 (two) times daily. Use in each nostril as directed 04/18/21   Ulyess Gammons, NP  cetirizine  (ZYRTEC ) 10 MG tablet Take 1 tablet (10 mg total) by mouth daily. 04/18/21   Ulyess Gammons, NP  methocarbamol  (ROBAXIN ) 500 MG tablet Take 1 tablet (500 mg total) by mouth 2 (two) times daily. 12/20/22   Blue, Soijett A, PA-C      Allergies    Penicillins    Review of Systems   Review of Systems  Gastrointestinal:  Positive for vomiting.    Physical Exam Updated Vital Signs BP 117/72 (BP Location: Left Arm)   Pulse 74   Temp 98.1 F (36.7 C)   Resp 18   Wt 86.2 kg   SpO2 97%   BMI 23.75 kg/m  Physical Exam Vitals and nursing note reviewed.  HENT:     Head: Normocephalic  and atraumatic.  Eyes:     Pupils: Pupils are equal, round, and reactive to light.  Cardiovascular:     Rate and Rhythm: Normal rate and regular rhythm.  Pulmonary:     Effort: Pulmonary effort is normal.     Breath sounds: Normal breath sounds.  Abdominal:     Palpations: Abdomen is soft.     Tenderness: There is no abdominal tenderness.  Skin:    General: Skin is warm and dry.  Neurological:     Mental Status: He is alert.  Psychiatric:        Mood and Affect: Mood normal.     ED Results / Procedures / Treatments   Labs (all labs ordered are listed, but only abnormal results are displayed) Labs Reviewed  COMPREHENSIVE METABOLIC PANEL WITH GFR - Abnormal; Notable for the following components:      Result Value   Glucose, Bld 100 (*)    All other components within normal limits  LIPASE, BLOOD  CBC    EKG None  Radiology No results found.  Procedures Procedures    Medications Ordered in ED Medications  ondansetron  (ZOFRAN ) tablet 4 mg (4 mg Oral Given 11/27/23 1636)    ED Course/ Medical Decision Making/ A&P  Medical Decision Making 16-year-old male otherwise healthy presenting for likely viral GI illness for the last 24 hours.  Abdominal pain generalized cramping nausea vomiting diarrhea.  No GI bleeding.  Laboratory workup unremarkable.  No evidence of severe dehydration.  Has been able to drink fluids at home.  Will prescribe Zofran  instructed for symptomatic management of likely viral GI illness.  Return precautions were discussed in detail.  Will also provide him with a work note  Amount and/or Complexity of Data Reviewed Labs: ordered.  Risk Prescription drug management.           Final Clinical Impression(s) / ED Diagnoses Final diagnoses:  Viral gastroenteritis    Rx / DC Orders ED Discharge Orders          Ordered    ondansetron  (ZOFRAN ) 4 MG tablet  Every 8 hours PRN        11/27/23 1641               Noach, Calv[place], DO 11/27/23 1641

## 2023-11-27 NOTE — Discharge Instructions (Signed)
 You were seen in the emergency room for abdominal pain nausea vomiting Your blood work looked okay and you are not severely dehydrated We gave you Zofran  here and have called a few days worth of Zofran  into your pharmacy to take as directed for nausea vomiting Be sure to keep well-hydrated at home Return to the emerged part for severe pain, if you are unable to eat or drink or have any other concerns Otherwise follow-up with your primary care doctor within 1 week You should be able to return to work
